# Patient Record
Sex: Female | Born: 1970 | Race: White | Hispanic: No | Marital: Married | Smoking: Never smoker
Health system: Southern US, Community
[De-identification: ages and names within clinical notes are randomized; demographics above are authoritative.]

## PROBLEM LIST (undated history)

## (undated) HISTORY — PX: CHOLECYSTECTOMY: SHX55

## (undated) HISTORY — PX: TONSILLECTOMY: SUR1361

---

## 1997-12-16 ENCOUNTER — Other Ambulatory Visit: Admission: RE | Admit: 1997-12-16 | Discharge: 1997-12-16 | Payer: Self-pay | Admitting: *Deleted

## 1998-10-26 ENCOUNTER — Ambulatory Visit (HOSPITAL_BASED_OUTPATIENT_CLINIC_OR_DEPARTMENT_OTHER): Admission: RE | Admit: 1998-10-26 | Discharge: 1998-10-26 | Payer: Self-pay | Admitting: Orthopedic Surgery

## 1998-12-16 ENCOUNTER — Other Ambulatory Visit: Admission: RE | Admit: 1998-12-16 | Discharge: 1998-12-16 | Payer: Self-pay | Admitting: *Deleted

## 1999-12-12 ENCOUNTER — Other Ambulatory Visit: Admission: RE | Admit: 1999-12-12 | Discharge: 1999-12-12 | Payer: Self-pay | Admitting: *Deleted

## 2000-12-13 ENCOUNTER — Other Ambulatory Visit: Admission: RE | Admit: 2000-12-13 | Discharge: 2000-12-13 | Payer: Self-pay | Admitting: *Deleted

## 2001-12-16 ENCOUNTER — Other Ambulatory Visit: Admission: RE | Admit: 2001-12-16 | Discharge: 2001-12-16 | Payer: Self-pay | Admitting: *Deleted

## 2003-01-05 ENCOUNTER — Other Ambulatory Visit: Admission: RE | Admit: 2003-01-05 | Discharge: 2003-01-05 | Payer: Self-pay | Admitting: *Deleted

## 2004-01-07 ENCOUNTER — Other Ambulatory Visit: Admission: RE | Admit: 2004-01-07 | Discharge: 2004-01-07 | Payer: Self-pay | Admitting: *Deleted

## 2005-01-06 ENCOUNTER — Other Ambulatory Visit: Admission: RE | Admit: 2005-01-06 | Discharge: 2005-01-06 | Payer: Self-pay | Admitting: Obstetrics and Gynecology

## 2005-08-01 ENCOUNTER — Ambulatory Visit: Payer: Self-pay | Admitting: Sports Medicine

## 2005-08-25 ENCOUNTER — Ambulatory Visit: Payer: Self-pay | Admitting: Sports Medicine

## 2015-10-11 ENCOUNTER — Other Ambulatory Visit: Payer: Self-pay | Admitting: Obstetrics and Gynecology

## 2015-10-11 DIAGNOSIS — R928 Other abnormal and inconclusive findings on diagnostic imaging of breast: Secondary | ICD-10-CM

## 2015-10-22 ENCOUNTER — Ambulatory Visit
Admission: RE | Admit: 2015-10-22 | Discharge: 2015-10-22 | Disposition: A | Discharge status: Home / Self Care | Payer: BC Managed Care – PPO | Source: Ambulatory Visit | Attending: Obstetrics and Gynecology | Admitting: Obstetrics and Gynecology

## 2015-10-22 DIAGNOSIS — R928 Other abnormal and inconclusive findings on diagnostic imaging of breast: Secondary | ICD-10-CM

## 2016-03-31 ENCOUNTER — Other Ambulatory Visit: Payer: Self-pay | Admitting: Obstetrics and Gynecology

## 2016-03-31 DIAGNOSIS — N6320 Unspecified lump in the left breast, unspecified quadrant: Secondary | ICD-10-CM

## 2016-05-02 ENCOUNTER — Ambulatory Visit
Admission: RE | Admit: 2016-05-02 | Discharge: 2016-05-02 | Disposition: A | Discharge status: Home / Self Care | Payer: BC Managed Care – PPO | Source: Ambulatory Visit | Attending: Obstetrics and Gynecology | Admitting: Obstetrics and Gynecology

## 2016-05-02 DIAGNOSIS — N6320 Unspecified lump in the left breast, unspecified quadrant: Secondary | ICD-10-CM

## 2016-09-19 ENCOUNTER — Encounter: Payer: Self-pay | Admitting: Sports Medicine

## 2016-09-19 ENCOUNTER — Ambulatory Visit (INDEPENDENT_AMBULATORY_CARE_PROVIDER_SITE_OTHER): Payer: BC Managed Care – PPO | Admitting: Sports Medicine

## 2016-09-19 ENCOUNTER — Ambulatory Visit: Payer: Self-pay

## 2016-09-19 VITALS — BP 120/54 | Ht 67.0 in | Wt 269.0 lb

## 2016-09-19 DIAGNOSIS — M25522 Pain in left elbow: Secondary | ICD-10-CM | POA: Diagnosis not present

## 2016-09-19 MED ORDER — METHYLPREDNISOLONE ACETATE 40 MG/ML IJ SUSP
40.0000 mg | Freq: Once | INTRAMUSCULAR | Status: AC
  Administered 2016-09-19: 40 mg via INTRA_ARTICULAR

## 2016-09-19 NOTE — Progress Notes (Signed)
  Melinda Jackson - 46 y.o. female MRN [id number]  Date of birth: 12-04-1970  SUBJECTIVE:  Including CC & ROS.   Melinda Jackson is a 46 yo F is a 46 year old female that is presenting with left elbow pain. This pain is occurring on the medial aspect of the past 2 months. She has been avidly working out and noticed it one day after doing a bicep curl. She denied any swelling or bruising at the time of the first incident. She has rested for the past 4-6 weeks with no improvement. She has tried deep massage and had no improvement. She denies any prior injury or surgery to the area. She feels like the pain is staying the same.  ROS: No unexpected weight loss, fever, chills, swelling, instability, numbness/tingling, redness, otherwise see HPI    HISTORY: Past Medical, Surgical, Social, and Family History Reviewed & Updated per EMR.   Pertinent Historical Findings include: PMSHx -  plantar fasciitis, tonsillectomy, cholecystectomy PSHx -  no tobacco use, occasional alcohol use FHx -  melanoma  DATA REVIEWED: None  PHYSICAL EXAM:  VS: BP:(!) 120/54  HR: bpm  TEMP: ( )  RESP:   HT:5\' 7"  (170.2 cm)   WT:269 lb (122 kg)  BMI:42.2 PHYSICAL EXAM: Gen: NAD, alert, cooperative with exam, well-appearing HEENT: clear conjunctiva, EOMI CV:  no edema, capillary refill brisk,  Resp: non-labored, normal speech Skin: no rashes, normal turgor  Neuro: no gross deficits.  Psych:  alert and oriented Left elbow:  No overlying erythema or swelling. Tenderness to palpation over the medial epicondyle. No tenderness to palpation over the olecranon or lateral epicondyle. Normal elbow range of motion. Normal Wrist range of motion. Pain with resisted flexion of the wrist Normal strength to resistance at the wrist. Negative Finkelstein's test. Negative Tinel's test at the elbow Neurovascular intact  Limited ultrasound: Left elbow:  The medial condyle was viewed in long axis and appears to be a small  hypoechoic changes in the middle of the tendon to suggest a partial tear The common extensors reviewed in long and short axis and found to be normal There appears to be increased hypervascularity at the medial condyle at the origin of the common flexors  Summary: Findings are consistent with medial epicondylitis   Ultrasound and interpretation by Melinda GandyJeremy Schmitz, MD   Aspiration/Injection Procedure Note Melinda Jackson 12-04-1970  Procedure: Injection Indications: Left medial elbow pain   Procedure Details Consent: Risks of procedure as well as the alternatives and risks of each were explained to the (patient/caregiver).  Consent for procedure obtained. Time Out: Verified patient identification, verified procedure, site/side was marked, verified correct patient position, special equipment/implants available, medications/allergies/relevent history reviewed, required imaging and test results available.  Performed.  The area was cleaned with iodine and alcohol swabs.    The left medial epicondyle was injected using 1 cc's of 40 mg Depomedrol with a 25 1 1/2" needle.  Ultrasound was used.   A sterile dressing was applied.  Patient did tolerate procedure well.   ASSESSMENT & PLAN:   Left elbow pain Appears that she has medial epicondylitis. Doesn't appear to have any older subluxation or compression of the cubital tunnel. - Injection performed today. - Provided home exercise program - Avoid lifts that normal flexion or exacerbating of her pain - Follow-up in 6 weeks

## 2016-09-19 NOTE — Assessment & Plan Note (Signed)
Appears that she has medial epicondylitis. Doesn't appear to have any older subluxation or compression of the cubital tunnel. - Injection performed today. - Provided home exercise program - Avoid lifts that normal flexion or exacerbating of her pain - Follow-up in 6 weeks

## 2016-10-09 ENCOUNTER — Other Ambulatory Visit: Payer: Self-pay | Admitting: Obstetrics and Gynecology

## 2016-10-09 DIAGNOSIS — N63 Unspecified lump in unspecified breast: Secondary | ICD-10-CM

## 2016-10-30 ENCOUNTER — Encounter: Payer: Self-pay | Admitting: Sports Medicine

## 2016-10-30 ENCOUNTER — Ambulatory Visit (INDEPENDENT_AMBULATORY_CARE_PROVIDER_SITE_OTHER): Payer: BC Managed Care – PPO | Admitting: Sports Medicine

## 2016-10-30 VITALS — BP 136/79 | Ht 67.0 in | Wt 269.0 lb

## 2016-10-30 DIAGNOSIS — M7700 Medial epicondylitis, unspecified elbow: Secondary | ICD-10-CM | POA: Insufficient documentation

## 2016-10-30 DIAGNOSIS — M7702 Medial epicondylitis, left elbow: Secondary | ICD-10-CM

## 2016-10-30 NOTE — Progress Notes (Signed)
[facility] Progress Note  Subjective:  Melinda Jackson is a 46 y.o. female who presents for follow-up of left medial epicondylitis. She was last seen for this at [facility] 09/19/16 but symptoms had been present for 2 months prior. She had steroid injection at last OV, which she think improved pain by about 50%. However, she had since also received 2 steroid IM injections and a 6-day steroid taper for bronchitis. She has tried some neutral wrist exercises at the gym at about 75% of normal weight and tolerated these okay. She had a little bit of pain when lifting a heavy plate yesterday but was able to turn her steering wheel without pain. She continues to wear her counterweight brace on L forearm. She no longer has L elbow pain at rest but does get "ghost twinges" from time to time. She is eager to get working on her arms again in the gym; works with a Systems analyst twice a week and has only been working her legs.  ROS: No numbness or tingling of hands; no fevers or chills  No Known Allergies  Objective: Blood pressure 136/79, height  (1.702 m), weight 269 lb (122 kg). Body mass index is 42.13 kg/m. Constitutional: Very pleasant, obese female in NAD Pulmonary/Chest: No respiratory distress.  Musculoskeletal: No fluid collection or swelling noted of L elbow. Minimal TTP with deep palpation over L medial epicondyle. Normal elbow and wrist ROM bilaterally. Strength 5/5 with resistance at wrist and with internal and external rotation of shoulder. Supination of L hand with wrist flexion minimally painful. No pain with resisted flexion of wrist. Negative Finkelstein's test.  Neurological: No decreased sensation of UEs. Negative Tinel's sign at elbow.  Skin: Skin is warm and dry. No rash noted. No erythema.  Psychiatric: Normal mood and affect.  Vitals reviewed  [place] of L elbow:  Small hypoechoic changes previously noted within common flexor tendon near medial epicondyle no longer seen;  instead hyperechoic changes suggestive of scar tissue noted  Assessment/Plan: Medial epicondylitis - Improving. 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  Dani Gobble, MD [facility], PGY-2  Patient seen and evaluated with the resident. I agree with the above plan of care. Patient's medial epicondylitis is improving. Repeat ultrasound shows scar tissue formation in the area of her previous tear. Proceed with activity as outlined above. Follow-up with me as needed.

## 2016-10-30 NOTE — Assessment & Plan Note (Signed)
-   Improving. 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

## 2016-11-02 ENCOUNTER — Ambulatory Visit
Admission: RE | Admit: 2016-11-02 | Discharge: 2016-11-02 | Disposition: A | Discharge status: Home / Self Care | Payer: BC Managed Care – PPO | Source: Ambulatory Visit | Attending: Obstetrics and Gynecology | Admitting: Obstetrics and Gynecology

## 2016-11-02 DIAGNOSIS — N63 Unspecified lump in unspecified breast: Secondary | ICD-10-CM

## 2017-05-06 IMAGING — US US BREAST 10
1 series · 5 of 5 positions shown · non-contrast
Comparison: Mammography 10/05/2015, 09/11/2014 and earlier.

CLINICAL DATA: Recall from screening mammography, bilateral breast
masses.

EXAM:
2D DIGITAL DIAGNOSTIC BILATERAL MAMMOGRAM WITH CAD AND ADJUNCT TOMO
LIMITED ULTRASOUND BILATERAL BREASTS

[Series 1: us breast 10 · 0.09mm/px · 5 of 5 slices shown]
[im 1/5]
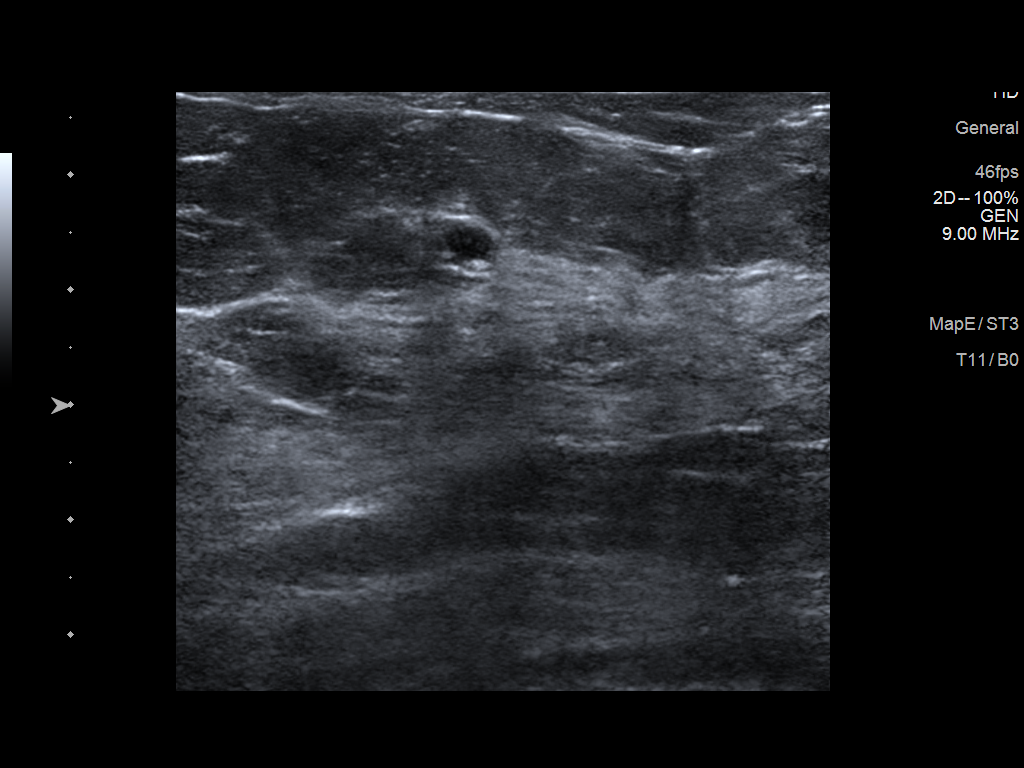
[im 2/5]
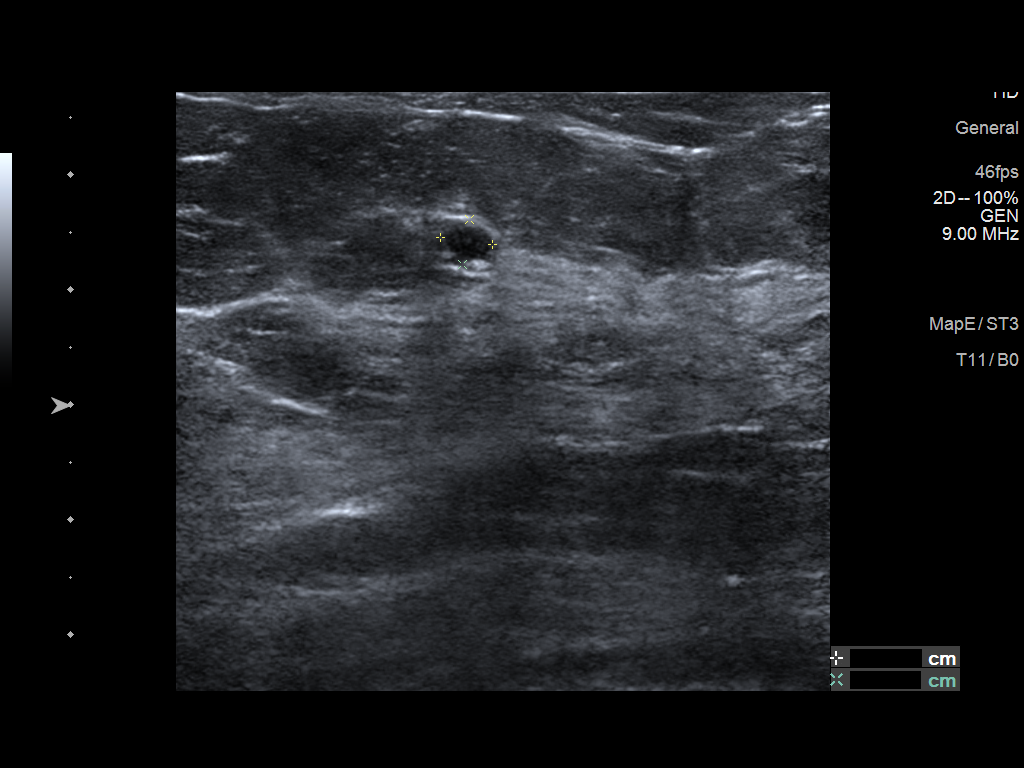
[im 3/5]
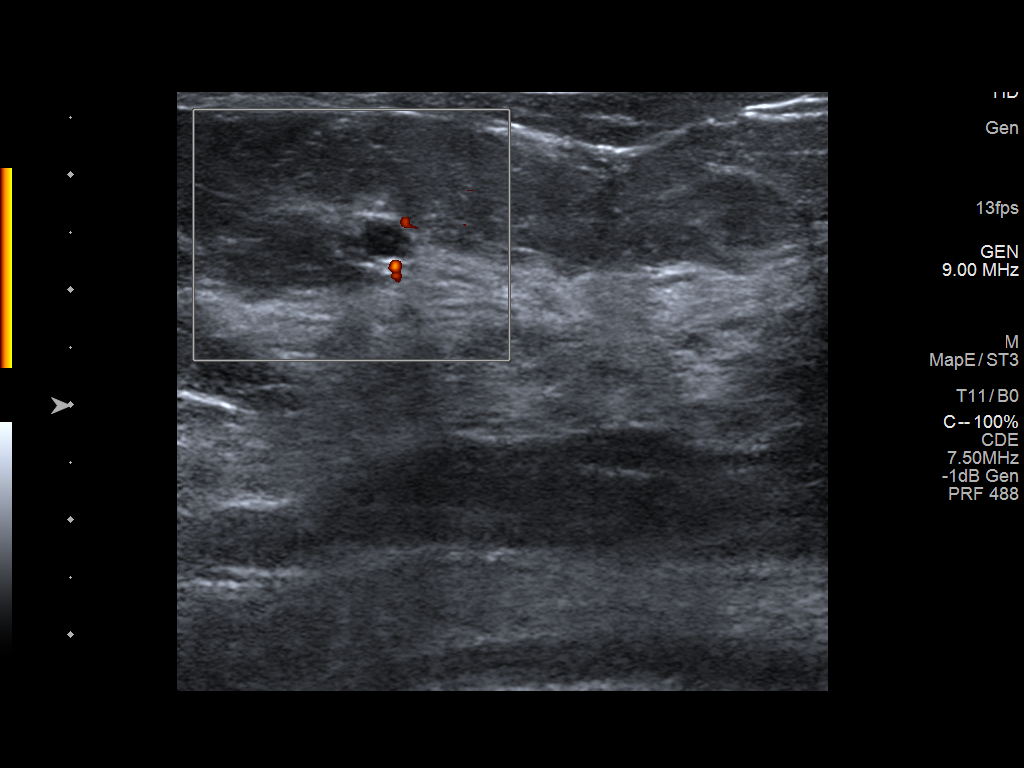
[im 4/5]
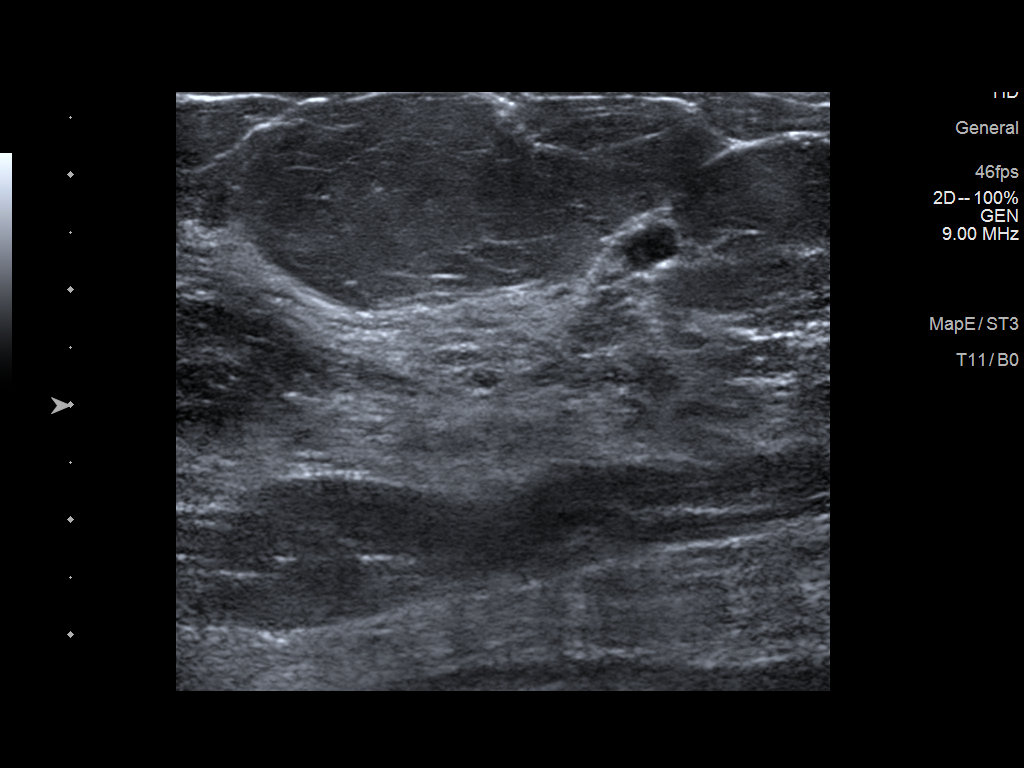
[im 5/5]
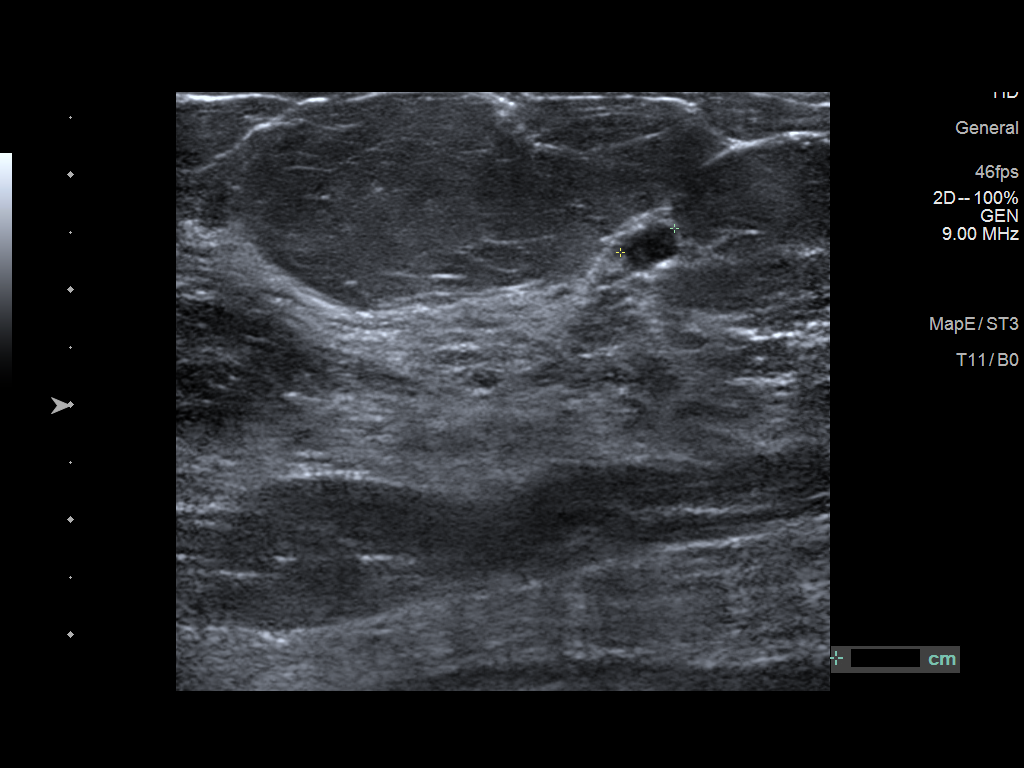

[5 of 5 positions shown; findings below may reference images not displayed]

No
prior ultrasound.

ACR Breast Density Category c: The breast tissue is heterogeneously
dense, which may obscure small masses.
FINDINGS: Standard 2D and tomosynthesis full field CC and MLO views of both
breasts were obtained.

The area of concern in the upper right breast, middle depth,
measures approximately 5 mm and is associated with a small
calcification. There is no associated architectural distortion. No
findings suspicious for malignancy in the right breast.

The area concern in the upper outer left breast, middle to posterior
depth, is less well seen on the tomosynthesis images, though there
are other circumscribed masses in the inner left breast without
associated architectural distortion or suspicious calcification. No
findings suspicious for malignancy in the left breast.

Mammographic images were processed with CAD.

On physical exam, there is no palpable abnormality in either breast.

Targeted right breast ultrasound is performed, showing an oval
circumscribed parallel nearly anechoic mass with acoustic
enhancement, no internal power Doppler flow, and a calcification in
its posterior wall at the 12:30 o'clock position approximately 4 cm
from the nipple measuring approximately 5 x 4 x 5 mm, accounting for
the area of concern on screening mammography. No suspicious solid
mass or abnormal acoustic shadowing is identified.

Targeted left breast ultrasound is performed, showing an oval
circumscribed hypoechoic mass with equal acoustic enhancement and no
internal power Doppler flow at the 1 o'clock position approximately
5 cm from the nipple measuring approximately 6 x 3 x 5 mm. No solid
mass or abnormal acoustic shadowing is identified.
IMPRESSION: 1. Likely benign complex cyst or fibroadenoma in the upper outer
quadrant of the left breast accounting for the area concern on
screening mammography.
2. Benign cyst in the upper inner quadrant of the right breast
accounting for the area of concern on screening mammography. No
mammographic or sonographic evidence of malignancy, right breast.

RECOMMENDATION:
Six-month follow-up left breast ultrasound to confirm stability of
the likely benign mass in the upper outer quadrant.

I have discussed the findings and recommendations with the patient.
Results were also provided in writing at the conclusion of the
visit. If applicable, a reminder letter will be sent to the patient
regarding the next appointment.

BI-RADS CATEGORY  3: Probably benign.

## 2017-05-06 IMAGING — US US BREAST 10
1 series · 5 of 5 positions shown · non-contrast
Comparison: Mammography 10/05/2015, 09/11/2014 and earlier.

CLINICAL DATA: Recall from screening mammography, bilateral breast
masses.

EXAM:
2D DIGITAL DIAGNOSTIC BILATERAL MAMMOGRAM WITH CAD AND ADJUNCT TOMO
LIMITED ULTRASOUND BILATERAL BREASTS

[Series 1: us breast 10 · 0.08mm/px · 5 of 5 slices shown]
[im 1/5]
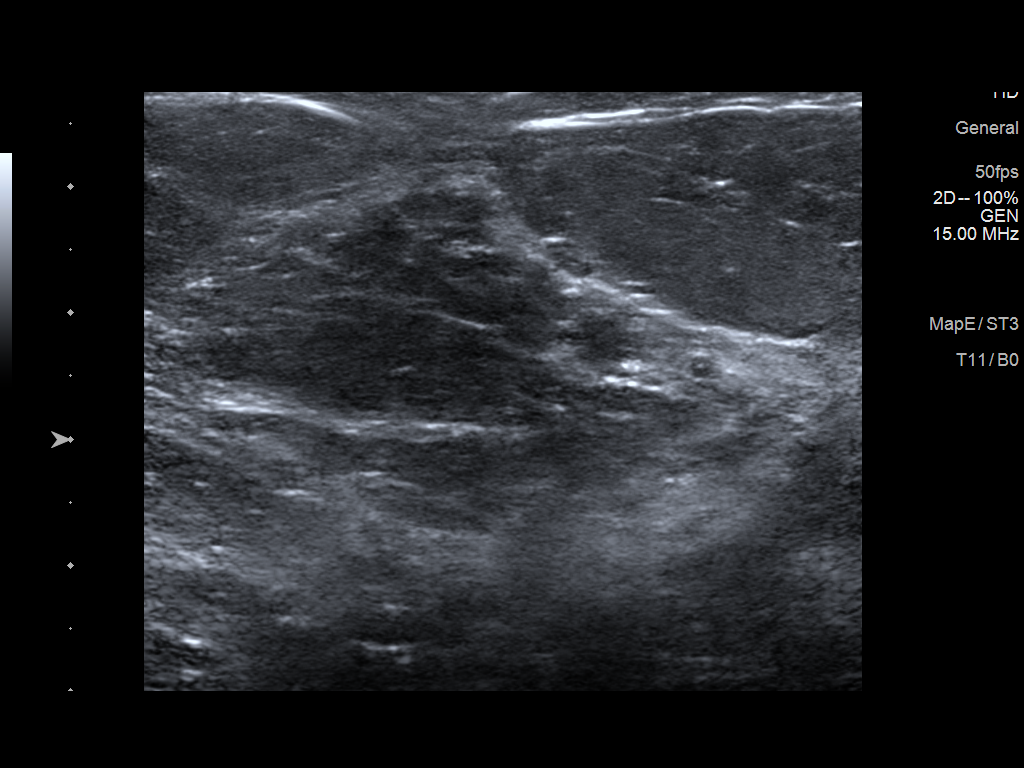
[im 2/5]
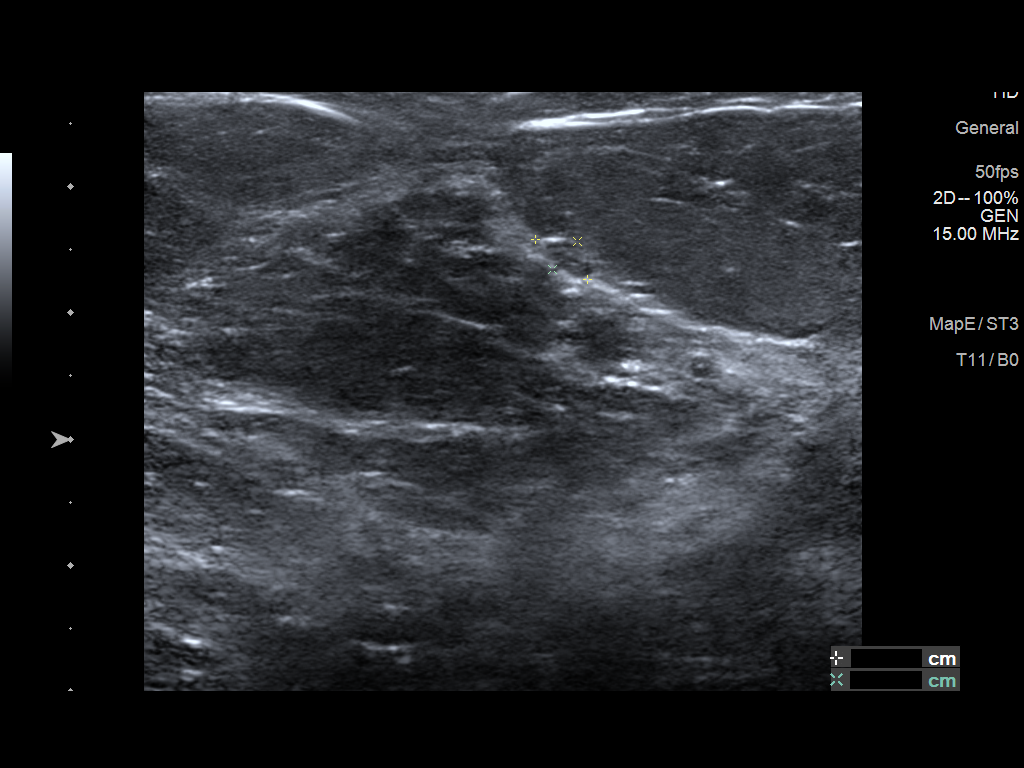
[im 3/5]
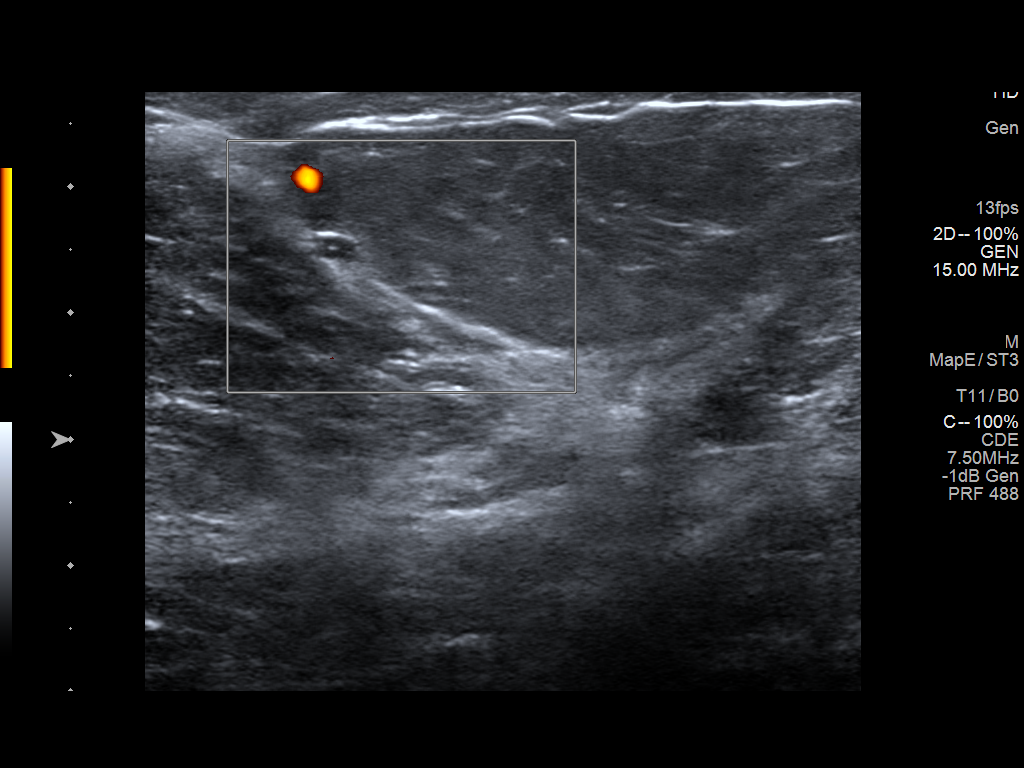
[im 4/5]
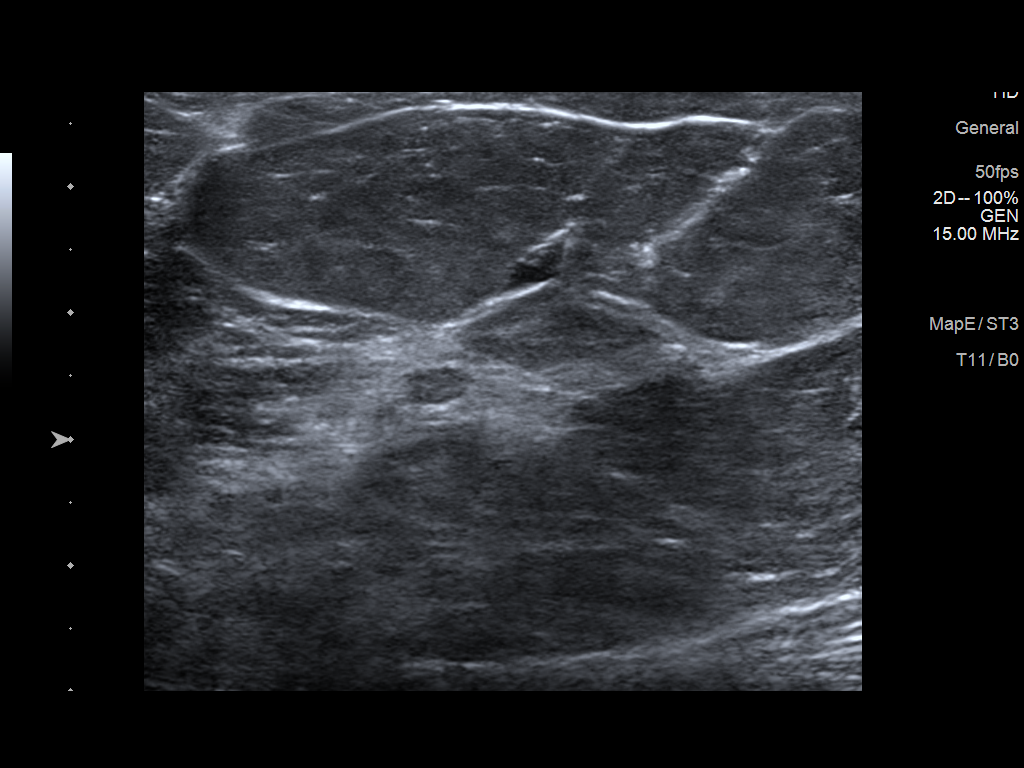
[im 5/5]
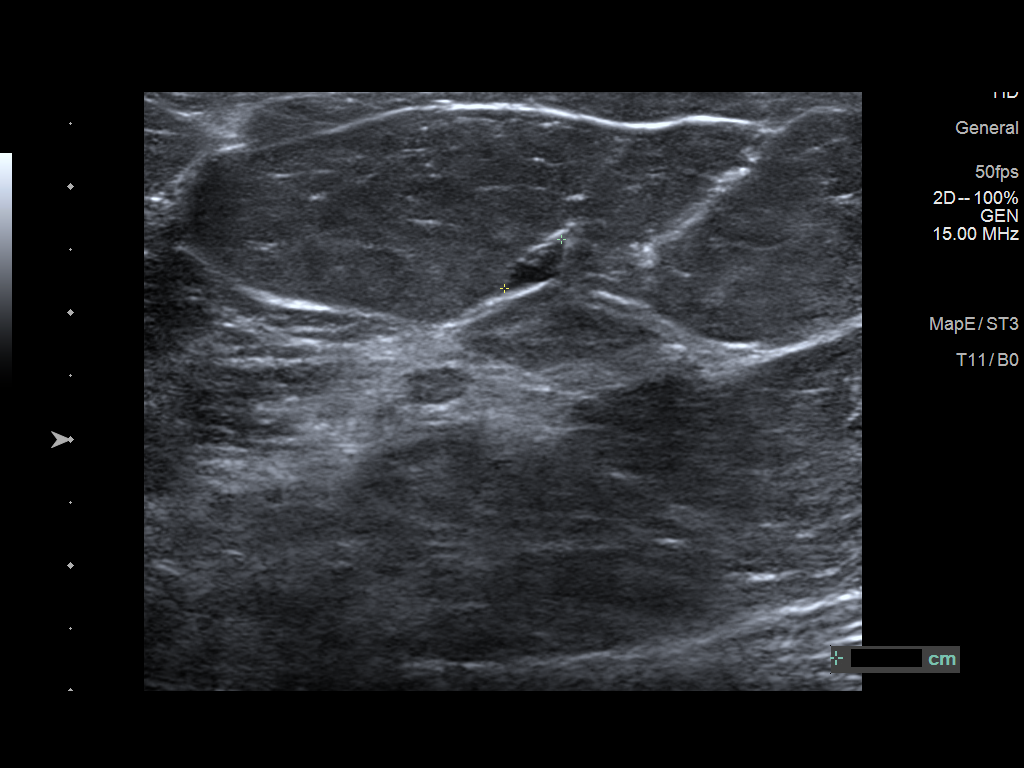

[5 of 5 positions shown; findings below may reference images not displayed]

No
prior ultrasound.

ACR Breast Density Category c: The breast tissue is heterogeneously
dense, which may obscure small masses.
FINDINGS: Standard 2D and tomosynthesis full field CC and MLO views of both
breasts were obtained.

The area of concern in the upper right breast, middle depth,
measures approximately 5 mm and is associated with a small
calcification. There is no associated architectural distortion. No
findings suspicious for malignancy in the right breast.

The area concern in the upper outer left breast, middle to posterior
depth, is less well seen on the tomosynthesis images, though there
are other circumscribed masses in the inner left breast without
associated architectural distortion or suspicious calcification. No
findings suspicious for malignancy in the left breast.

Mammographic images were processed with CAD.

On physical exam, there is no palpable abnormality in either breast.

Targeted right breast ultrasound is performed, showing an oval
circumscribed parallel nearly anechoic mass with acoustic
enhancement, no internal power Doppler flow, and a calcification in
its posterior wall at the 12:30 o'clock position approximately 4 cm
from the nipple measuring approximately 5 x 4 x 5 mm, accounting for
the area of concern on screening mammography. No suspicious solid
mass or abnormal acoustic shadowing is identified.

Targeted left breast ultrasound is performed, showing an oval
circumscribed hypoechoic mass with equal acoustic enhancement and no
internal power Doppler flow at the 1 o'clock position approximately
5 cm from the nipple measuring approximately 6 x 3 x 5 mm. No solid
mass or abnormal acoustic shadowing is identified.
IMPRESSION: 1. Likely benign complex cyst or fibroadenoma in the upper outer
quadrant of the left breast accounting for the area concern on
screening mammography.
2. Benign cyst in the upper inner quadrant of the right breast
accounting for the area of concern on screening mammography. No
mammographic or sonographic evidence of malignancy, right breast.

RECOMMENDATION:
Six-month follow-up left breast ultrasound to confirm stability of
the likely benign mass in the upper outer quadrant.

I have discussed the findings and recommendations with the patient.
Results were also provided in writing at the conclusion of the
visit. If applicable, a reminder letter will be sent to the patient
regarding the next appointment.

BI-RADS CATEGORY  3: Probably benign.

## 2017-10-12 ENCOUNTER — Other Ambulatory Visit: Payer: Self-pay | Admitting: Family Medicine

## 2017-10-12 ENCOUNTER — Ambulatory Visit
Admission: RE | Admit: 2017-10-12 | Discharge: 2017-10-12 | Disposition: A | Discharge status: Home / Self Care | Payer: Self-pay | Source: Ambulatory Visit | Attending: Family Medicine | Admitting: Family Medicine

## 2017-10-12 ENCOUNTER — Ambulatory Visit
Admission: RE | Admit: 2017-10-12 | Discharge: 2017-10-12 | Disposition: A | Discharge status: Home / Self Care | Payer: BC Managed Care – PPO | Source: Ambulatory Visit | Attending: Family Medicine | Admitting: Family Medicine

## 2017-10-12 DIAGNOSIS — J189 Pneumonia, unspecified organism: Secondary | ICD-10-CM

## 2017-10-12 DIAGNOSIS — J181 Lobar pneumonia, unspecified organism: Principal | ICD-10-CM

## 2017-12-24 ENCOUNTER — Other Ambulatory Visit: Payer: Self-pay | Admitting: Obstetrics and Gynecology

## 2017-12-24 DIAGNOSIS — Z1231 Encounter for screening mammogram for malignant neoplasm of breast: Secondary | ICD-10-CM

## 2018-01-14 ENCOUNTER — Ambulatory Visit
Admission: RE | Admit: 2018-01-14 | Discharge: 2018-01-14 | Disposition: A | Discharge status: Home / Self Care | Payer: BC Managed Care – PPO | Source: Ambulatory Visit | Attending: Obstetrics and Gynecology | Admitting: Obstetrics and Gynecology

## 2018-01-14 DIAGNOSIS — Z1231 Encounter for screening mammogram for malignant neoplasm of breast: Secondary | ICD-10-CM

## 2019-03-03 ENCOUNTER — Other Ambulatory Visit: Payer: Self-pay | Admitting: Family Medicine

## 2019-03-03 DIAGNOSIS — M7989 Other specified soft tissue disorders: Secondary | ICD-10-CM

## 2019-03-06 ENCOUNTER — Ambulatory Visit
Admission: RE | Admit: 2019-03-06 | Discharge: 2019-03-06 | Disposition: A | Discharge status: Home / Self Care | Payer: BC Managed Care – PPO | Source: Ambulatory Visit | Attending: Family Medicine | Admitting: Family Medicine

## 2019-03-06 DIAGNOSIS — M7989 Other specified soft tissue disorders: Secondary | ICD-10-CM

## 2019-07-01 ENCOUNTER — Other Ambulatory Visit: Payer: Self-pay

## 2019-07-01 ENCOUNTER — Encounter: Payer: Self-pay | Admitting: Sports Medicine

## 2019-07-01 ENCOUNTER — Ambulatory Visit: Payer: BC Managed Care – PPO | Admitting: Sports Medicine

## 2019-07-01 VITALS — BP 146/80 | Ht 67.0 in | Wt 260.4 lb

## 2019-07-01 DIAGNOSIS — S46002A Unspecified injury of muscle(s) and tendon(s) of the rotator cuff of left shoulder, initial encounter: Secondary | ICD-10-CM

## 2019-07-01 NOTE — Progress Notes (Signed)
   [facility] [address] [place], [place] [address] Phone: [phone] Fax: [phone]   Patient Name: Melinda Jackson Date of Birth: 04/11/1971 Medical Record Number: [id number] Gender: female Date of Encounter: 07/01/2019  SUBJECTIVE:      Chief Complaint:  Left shoulder pain   HPI:  Is a 48 year old RHD female with 3 months of left shoulder pain.  There was no specific mechanism of injury.  Aggravating factors include extending her arm behind her.  Alleviating factors include rest and ibuprofen.  The pain has gradually worsened over the 3 months.  He denies any radiating symptoms into her fingers.  No prior injury to the shoulder before.  She denies any swelling, erythema, weakness, instability, numbness, tingling, or skin changes.     ROS:     See HPI.   PERTINENT  PMH / PSH / FH / SH:  Past Medical, Surgical, Social, and Family History Reviewed & Updated in the EMR. Pertinent findings include:  Medial epicondylitis, obesity, hypothyroidism   OBJECTIVE:  BP (!) 146/80   Ht 5\' 7"  (1.702 m)   Wt 260 lb 6.4 oz (118.1 kg)   BMI 40.78 kg/m  Physical Exam:  Vital signs are reviewed.   GEN: Alert and oriented, NAD Pulm: Breathing unlabored PSY: normal mood, congruent affect  MSK: Left shoulder Well developed, well nourished, in no acute distress. No swelling, ecchymoses.  No gross deformity. No TTP. FROM. Strength is 4/5 with empty can and resisted external rotation with pain Positive Neers. Negative Yergasons. Negative apprehension. NV intact distally.  Right shoulder Well developed, well nourished, in no acute distress. No swelling, ecchymoses.  No gross deformity. No TTP. FROM. Strength 5/5 with empty can and resisted internal/external rotation. Negative Hawkins, Neers. Negative Yergasons. Negative apprehension. NV intact distally.  Limited MSK Ultrasound: Right shoulder No evidence of joint effusion.   The biceps  brachii long head tendon is normal without tendinosis, tear, tenosynovitis, or subluxation/dislocation in short and long axis view. 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 Left rotator cuff tendinosis  We will trial home exercise program and rotator cuff strengthening exercises.  She can continue to use ibuprofen as needed.  We will see her back in 4-6 weeks, at which time if there is minimal to no improvement we can consider a corticosteroid injection.   Lanier Clam, DO, ATC Sports Medicine Fellow  Patient seen and evaluated with the sports medicine fellow.  I agree with the above plan of care.  No obvious rotator cuff tear seen on ultrasound.  Treatment as above.  Follow-up in 4 weeks.  If no improvement consider subacromial cortisone injection and formal physical therapy.

## 2019-07-29 ENCOUNTER — Ambulatory Visit: Payer: Self-pay

## 2019-07-29 ENCOUNTER — Other Ambulatory Visit: Payer: Self-pay

## 2019-07-29 ENCOUNTER — Ambulatory Visit: Payer: BC Managed Care – PPO | Admitting: Sports Medicine

## 2019-07-29 VITALS — BP 124/82 | Ht 67.0 in | Wt 255.0 lb

## 2019-07-29 DIAGNOSIS — M19012 Primary osteoarthritis, left shoulder: Secondary | ICD-10-CM | POA: Diagnosis not present

## 2019-07-29 DIAGNOSIS — M25512 Pain in left shoulder: Secondary | ICD-10-CM

## 2019-07-29 MED ORDER — METHYLPREDNISOLONE ACETATE 40 MG/ML IJ SUSP
40.0000 mg | Freq: Once | INTRAMUSCULAR | Status: AC
  Administered 2019-07-29: 40 mg via INTRA_ARTICULAR

## 2019-07-29 NOTE — Progress Notes (Signed)
   [facility] [address] [place], [place] [address] Phone: [phone] Fax: [phone]   Patient Name: Melinda Jackson Date of Birth: 04/28/1971 Medical Record Number: [id number] Gender: female Date of Encounter: 07/29/2019  SUBJECTIVE:      Chief Complaint:  Left shoulder pain follow-up   HPI:  Issa is following up for left shoulder pain.  We last saw her 1 month ago at which time she was started on a home exercise program for rotator cuff tendinosis.  She has been diligent with the exercises, but has not noticed much of a difference.  She still has some pain with extension and external rotation.  Her pain is mainly on the top of her shoulder.  She denies any numbness or tingling into her fingers.  No new injury or swelling.     ROS:     See HPI.   PERTINENT  PMH / PSH / FH / SH:  Past Medical, Surgical, Social, and Family History Reviewed & Updated in the EMR.    OBJECTIVE:  BP 124/82   Ht 5\' 7"  (1.702 m)   Wt 255 lb (115.7 kg)   BMI 39.94 kg/m  Physical Exam:  Vital signs are reviewed.   GEN: Alert and oriented, NAD Pulm: Breathing unlabored PSY: normal mood, congruent affect  MSK: Left shoulder Well developed, well nourished, in no acute distress. No swelling, ecchymoses.  No gross deformity. TTP at [facility] joint FROM. Strength 5/5 with empty can and resisted internal/external rotation. Negative Hawkins, Neers. Negative Yergasons. Negative apprehension. Positive crossarm test NV intact distally.  Procedure Left AC joint injection After informed consent was obtained, the patient was seated in the exam room.  Her left shoulder was cleaned in a sterile manner.  Utilizing an out of plane approach, the left AC joint was injected under ultrasound guidance with a 1: 1 mixture of depomedrol:Lidocaine 1%.  Patient tolerated the procedure well complication.  ASSESSMENT & PLAN:   1. Left AC joint arthropathy  Successful injection as  above.  We are hopeful that this will offer her some pain relief.  She can use topical Voltaren gel if still painful.  We advised her to follow-up with [facility] in some fashion in 1 month, if still having symptoms, at that time we can consider an MRI.   [place], DO, ATC Sports Medicine Fellow  Patient seen and evaluated with the sports medicine fellow.  I agree with the above plan of care.  Patient has good rotator cuff strength and no real signs of impingement.  Main pain is over the acromioclavicular joint.  Injection as above.  If patient continues to have pain despite injection and I would consider further diagnostic imaging.  I have asked that she follow-up with me in about 4 weeks for check on her progress.

## 2019-08-01 NOTE — Addendum Note (Signed)
Addended by: Rutha Bouchard E on: 08/01/2019 09:19 AM   Modules accepted: Orders

## 2019-08-19 ENCOUNTER — Ambulatory Visit
Admission: RE | Admit: 2019-08-19 | Discharge: 2019-08-19 | Disposition: A | Discharge status: Home / Self Care | Payer: BC Managed Care – PPO | Source: Ambulatory Visit | Attending: Sports Medicine | Admitting: Sports Medicine

## 2019-08-19 ENCOUNTER — Telehealth (INDEPENDENT_AMBULATORY_CARE_PROVIDER_SITE_OTHER): Payer: BC Managed Care – PPO | Admitting: Sports Medicine

## 2019-08-19 VITALS — Ht 67.0 in | Wt 247.0 lb

## 2019-08-19 DIAGNOSIS — M25512 Pain in left shoulder: Secondary | ICD-10-CM

## 2019-08-20 ENCOUNTER — Other Ambulatory Visit: Payer: Self-pay

## 2019-08-20 DIAGNOSIS — M25512 Pain in left shoulder: Secondary | ICD-10-CM

## 2019-08-20 NOTE — Progress Notes (Signed)
   Subjective:    Patient ID: Melinda Jackson, female    DOB: [birth year], 49 y.o.   MRN: [id number]  HPI  Patient presents today via virtual visit for follow-up on left shoulder pain.  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  Unfortunately, this did not provide her with much pain relief.  She still endorses pain in the anterior shoulder around the area of the [facility] joint with reaching directly overhead or away from her body.  Also pain with reaching around behind her back.  Previous ultrasound had shown evidence of supraspinatus tendinosis but no obvious tear.  She has been very diligent with her home exercises.   Review of Systems As above    Objective:   Physical Exam  Physical exam was somewhat limited due to the nature of our video visit.  She does demonstrate full range of motion.  She localizes her pain around the area of the acromioclavicular joint.  No real pain with crossover testing.  She appears neurovascularly intact.  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  Otherwise unremarkable.      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  Although her previous ultrasound did not show any obvious rotator cuff tear, we will need to proceed with an MRI of the left shoulder primarily for preoperative planning.  Patient is interested in a distal clavicle excision if warranted.  The MRI will also evaluate for other potential shoulder pathology not seen on ultrasound or x-ray.  Phone follow-up with those results when available and we will delineate further treatment based on those  findings.

## 2019-09-09 ENCOUNTER — Ambulatory Visit
Admission: RE | Admit: 2019-09-09 | Discharge: 2019-09-09 | Disposition: A | Discharge status: Home / Self Care | Payer: BC Managed Care – PPO | Source: Ambulatory Visit | Attending: Sports Medicine | Admitting: Sports Medicine

## 2019-09-09 DIAGNOSIS — M25512 Pain in left shoulder: Secondary | ICD-10-CM

## 2019-09-15 ENCOUNTER — Telehealth: Payer: Self-pay | Admitting: Sports Medicine

## 2019-09-15 ENCOUNTER — Other Ambulatory Visit: Payer: Self-pay | Admitting: Obstetrics and Gynecology

## 2019-09-15 ENCOUNTER — Other Ambulatory Visit: Payer: Self-pay

## 2019-09-15 DIAGNOSIS — R928 Other abnormal and inconclusive findings on diagnostic imaging of breast: Secondary | ICD-10-CM

## 2019-09-15 DIAGNOSIS — M25512 Pain in left shoulder: Secondary | ICD-10-CM

## 2019-09-15 NOTE — Telephone Encounter (Signed)
  I spoke with Melinda Jackson on the phone today after reviewing MRI findings of her left shoulder.  She has moderate tendinosis of the supraspinatus tendon.  Also a possible longitudinal split tear of the proximal biceps tendon.  Mild to moderate AC degenerative changes.  Given her failure to improve with conservative treatment to date, I've  recommended a consultation with Dr. Everardo Pacific at [facility] to discuss possible surgical options.  I will defer further work-up and treatment to the discretion of Dr. Everardo Pacific and the patient will follow up with me as needed.

## 2019-09-29 ENCOUNTER — Ambulatory Visit
Admission: RE | Admit: 2019-09-29 | Discharge: 2019-09-29 | Disposition: A | Discharge status: Home / Self Care | Payer: BC Managed Care – PPO | Source: Ambulatory Visit | Attending: Obstetrics and Gynecology | Admitting: Obstetrics and Gynecology

## 2019-09-29 ENCOUNTER — Other Ambulatory Visit: Payer: Self-pay | Admitting: Obstetrics and Gynecology

## 2019-09-29 ENCOUNTER — Other Ambulatory Visit: Payer: Self-pay

## 2019-09-29 DIAGNOSIS — R928 Other abnormal and inconclusive findings on diagnostic imaging of breast: Secondary | ICD-10-CM

## 2019-09-29 DIAGNOSIS — N6310 Unspecified lump in the right breast, unspecified quadrant: Secondary | ICD-10-CM

## 2019-12-10 ENCOUNTER — Other Ambulatory Visit: Payer: Self-pay | Admitting: Obstetrics and Gynecology

## 2019-12-10 MED ORDER — LORAZEPAM 1 MG PO TABS: 1.0000 mg | ORAL_TABLET | Freq: Two times a day (BID) | ORAL | 0 refills | Status: DC

## 2019-12-10 NOTE — Progress Notes (Signed)
Spoke to pt after traumatic experience.  She is crying uncontrollably and I will send in ativan to help her over next couple days.

## 2019-12-10 NOTE — Addendum Note (Signed)
Addended by: Carrington Clamp A on: 12/10/2019 05:29 PM   Modules accepted: Orders

## 2020-04-05 ENCOUNTER — Other Ambulatory Visit: Payer: Self-pay | Admitting: Obstetrics and Gynecology

## 2020-04-05 ENCOUNTER — Other Ambulatory Visit: Payer: Self-pay

## 2020-04-05 ENCOUNTER — Ambulatory Visit
Admission: RE | Admit: 2020-04-05 | Discharge: 2020-04-05 | Disposition: A | Discharge status: Home / Self Care | Payer: BC Managed Care – PPO | Source: Ambulatory Visit | Attending: Obstetrics and Gynecology | Admitting: Obstetrics and Gynecology

## 2020-04-05 DIAGNOSIS — N6310 Unspecified lump in the right breast, unspecified quadrant: Secondary | ICD-10-CM

## 2020-06-15 ENCOUNTER — Ambulatory Visit (INDEPENDENT_AMBULATORY_CARE_PROVIDER_SITE_OTHER): Payer: BC Managed Care – PPO | Admitting: Sports Medicine

## 2020-06-15 ENCOUNTER — Other Ambulatory Visit: Payer: Self-pay

## 2020-06-15 VITALS — BP 124/82 | Ht 67.0 in | Wt 202.0 lb

## 2020-06-15 DIAGNOSIS — S66911A Strain of unspecified muscle, fascia and tendon at wrist and hand level, right hand, initial encounter: Secondary | ICD-10-CM

## 2020-06-15 MED ORDER — MELOXICAM 7.5 MG PO TABS: ORAL_TABLET | ORAL | 0 refills | Status: DC

## 2020-06-16 ENCOUNTER — Encounter: Payer: Self-pay | Admitting: Sports Medicine

## 2020-06-16 NOTE — Progress Notes (Signed)
   Subjective:    Patient ID: Melinda Jackson, female    DOB: 07-18-1970, 49 y.o.   MRN: [id number]  HPI chief complaint: Right wrist pain  Very pleasant 49 year old right-hand-dominant female comes in today complaining of right wrist pain that began while playing tennis on November 27.  After an 18 year absence, she recently decided to take up tennis again.  She has really been enjoying it.  On 27 November, she was hitting with a very good player returning his serves.  She felt a "twinge" along the ulnar aspect of the right wrist but continued to play.  Pain continued to intensify.  She has not played tennis since and her pain has improved but not resolved.  She notices it most when doing certain activities such as pulling down on the bottom of her shirt.  She has been taking 7.5 mg of meloxicam which has been helping.  She takes it at night because it makes her sleepy.  She also applied ice to her wrist immediately after the injury.  She denies any significant problems with this wrist in the past.  She denies radial sided wrist pain.  She may have noticed some slight swelling at the time of the injury but no bruising.  Inner medical history reviewed Interim surgical history reviewed Medications reviewed Allergies reviewed    Review of Systems As above    Objective:   Physical Exam  Well-developed, well-nourished.  No acute distress.  Right wrist: Full wrist range of motion.  No effusion.  No obvious soft tissue swelling.  No ecchymosis.  Slight tenderness to palpation along the distal ECU tendon.  Reproducible pain with passive radial deviation of the wrist.  No pain or weakness with resisted ulnar deviation.  No tenderness to palpation at the TFCC.  Negative liftoff.  No tenderness at the snuffbox.  Good grip strength.  Good pulses.  Limited MSK ultrasound of the right wrist was performed.  There is fluid surrounding the ECU tendon but no obvious tear.  Visualized TFCC appears  unremarkable.      Assessment & Plan:   Right wrist pain secondary to ECU tendon strain  Patient will continue on her meloxicam 7.5 mg nightly for the next 7 days.  I did provide her with a refill.  We will fit her with a body helix wrist sleeve which she will wear daily until her symptoms resolve.  I then want her to wear it when she resumes tennis.  I explained to her that it may take 3 to 4 weeks for this injury to improve to the point that she is able to return to competitive tennis.  If symptoms persist, she will return to the office and we will consider further diagnostic imaging.  Follow-up for ongoing or recalcitrant issues.

## 2020-07-29 ENCOUNTER — Other Ambulatory Visit: Payer: Self-pay | Admitting: Sports Medicine

## 2020-09-21 ENCOUNTER — Other Ambulatory Visit: Payer: Self-pay | Admitting: Sports Medicine

## 2020-10-04 ENCOUNTER — Other Ambulatory Visit: Payer: Self-pay

## 2020-10-04 ENCOUNTER — Ambulatory Visit
Admission: RE | Admit: 2020-10-04 | Discharge: 2020-10-04 | Disposition: A | Discharge status: Home / Self Care | Payer: BC Managed Care – PPO | Source: Ambulatory Visit | Attending: Obstetrics and Gynecology | Admitting: Obstetrics and Gynecology

## 2020-10-04 DIAGNOSIS — N6310 Unspecified lump in the right breast, unspecified quadrant: Secondary | ICD-10-CM

## 2020-10-19 ENCOUNTER — Other Ambulatory Visit: Payer: Self-pay | Admitting: Sports Medicine

## 2021-04-13 IMAGING — MG MM DIGITAL DIAGNOSTIC UNILAT*R* W/ TOMO W/ CAD
6 series · 6 of 18 positions shown · non-contrast
Comparison: Previous exam(s).

CLINICAL DATA: Recall from screening mammography with
tomosynthesis, possible masses involving the RIGHT breast.

EXAM:
DIGITAL DIAGNOSTIC RIGHT MAMMOGRAM WITH TOMO
ULTRASOUND RIGHT BREAST

[R MLO synth-2D (1 of 2)]
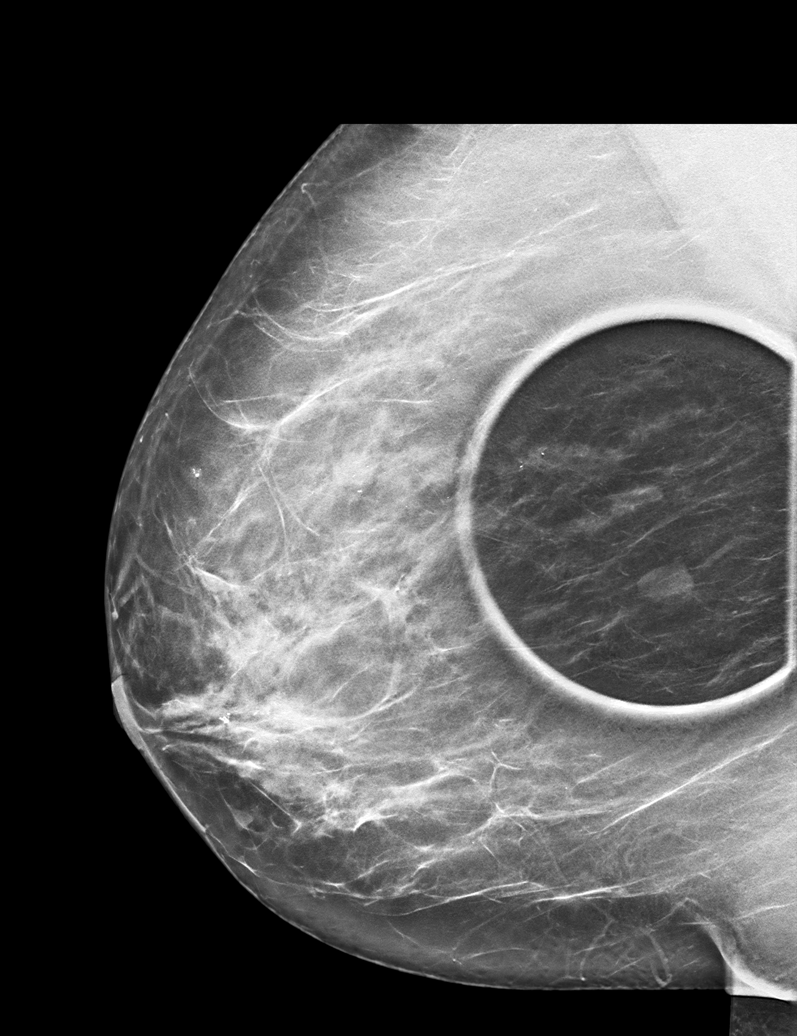

[R CC synth-2D]
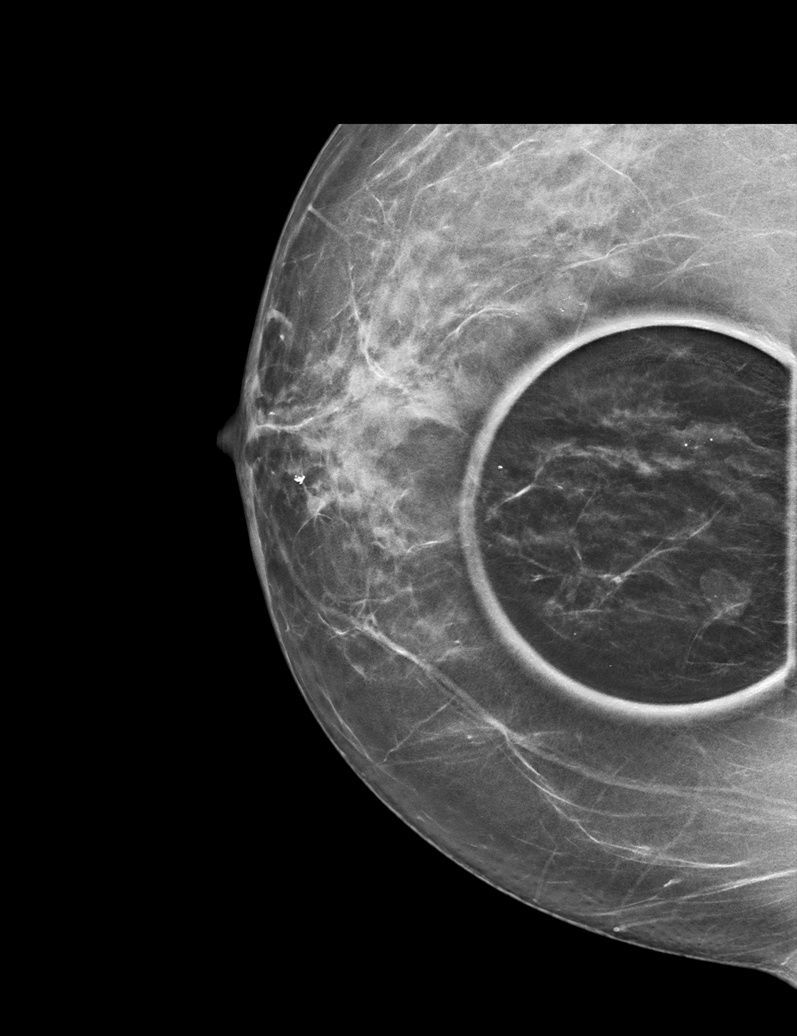

[R MLO synth-2D (2 of 2)]
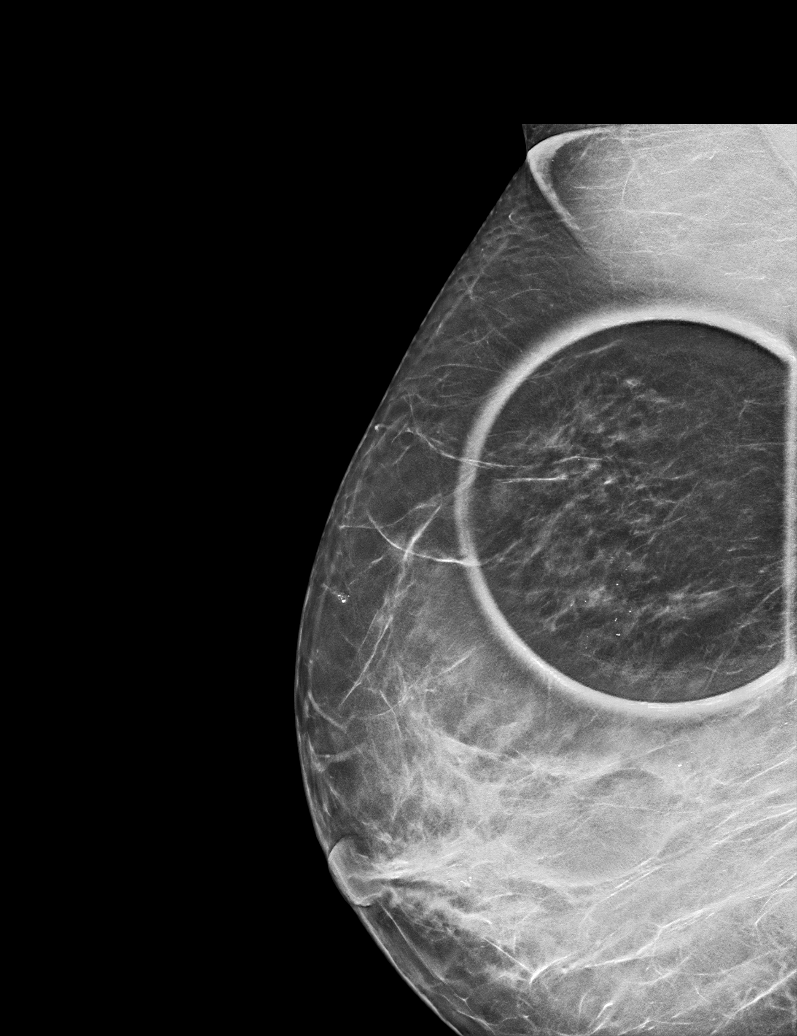

[R MLO tomo (1 of 2) · tomo slice 31/60.0]
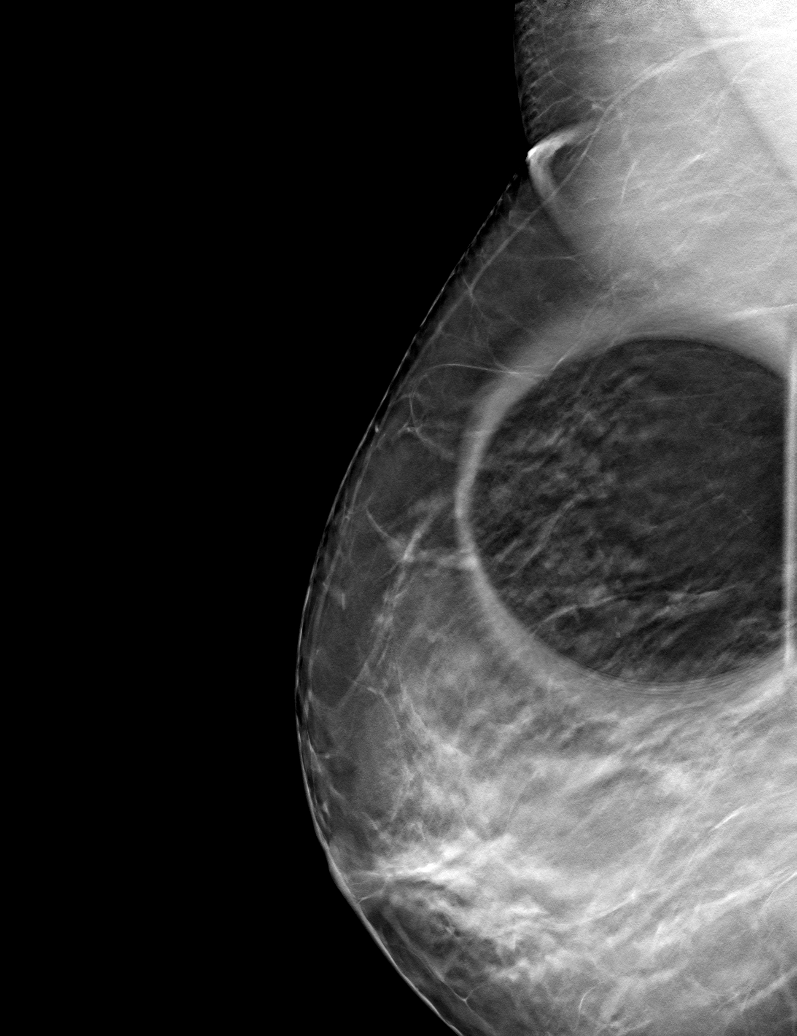

[R MLO tomo (2 of 2) · tomo slice 33/65.0]
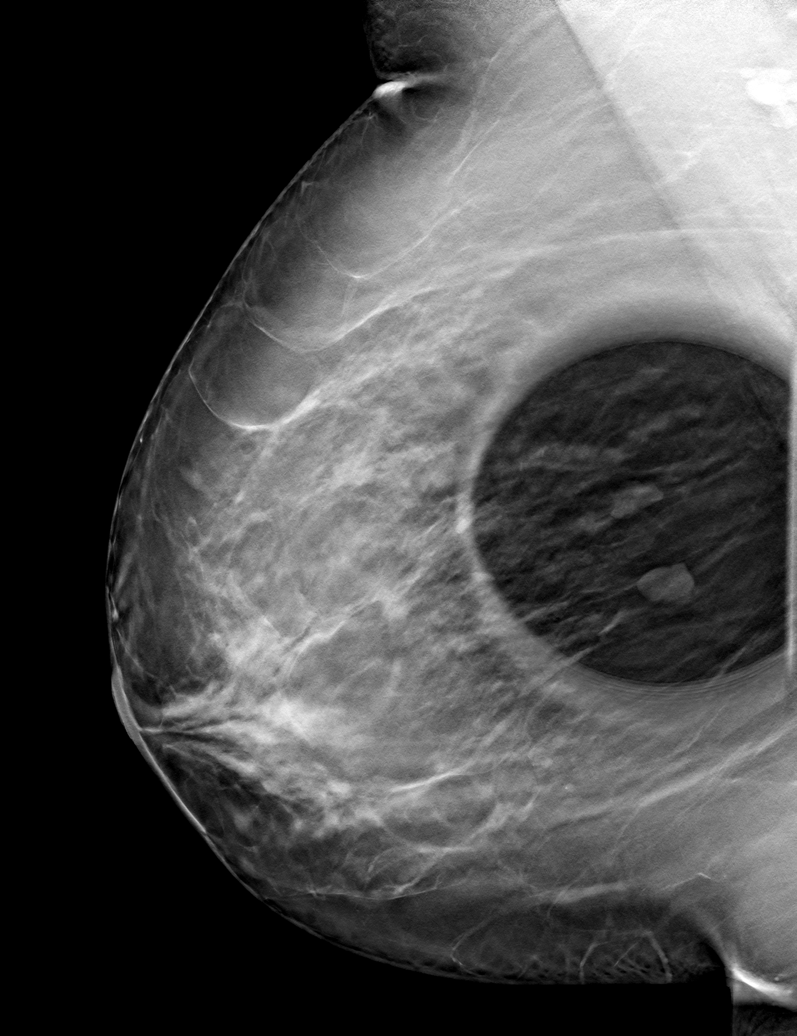

[R CC tomo · tomo slice 27/54.0]
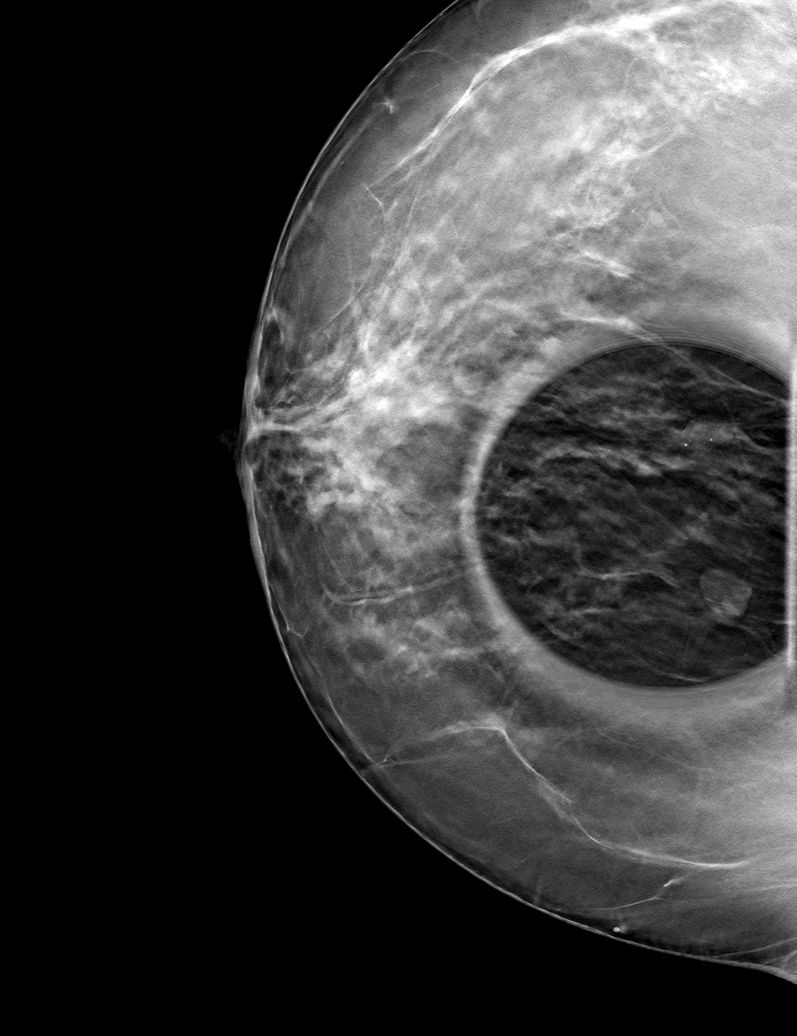

[6 of 18 positions shown; findings below may reference images not displayed]

ACR Breast Density Category b: There are scattered areas of
fibroglandular density.
FINDINGS: Tomosynthesis and synthesized spot-compression CC and MLO views of
the area of concern in the RIGHT breast were obtained.

Spot compression images confirm at least 2 possibly 3 adjacent
isodense circumscribed masses localizing to the UPPER INNER QUADRANT
at MIDDLE to POSTERIOR depth, the largest measuring approximately
1.2 cm. The more superior of the masses is associated with scattered
calcifications. There is no associated architectural distortion.

Targeted RIGHT breast ultrasound is performed, showing oval
circumscribed parallel clustered cysts at the 1 o'clock position
approximately 7 cm from nipple MIDDLE depth, measuring approximately
0.5 x 0.7 x 1.1 cm, containing thin internal septations and
calcifications, demonstrating posterior acoustic enhancement and no
internal power Doppler flow corresponding to the more superior of
the mammographic findings.

At the 12:30 o'clock position approximately 6 cm from nipple at
MIDDLE depth are similar oval circumscribed parallel clustered cysts
measuring approximately 0.3 x 1.0 x 1.0 cm, containing thin internal
septations, demonstrating posterior acoustic enhancement and no
internal power Doppler flow.

At the 1 o'clock position approximately 6 cm from nipple at
POSTERIOR depth is an oval circumscribed parallel nearly anechoic
mass with scattered internal echoes measuring approximately 0 3 x
0.9 x 0.7 cm, demonstrating posterior acoustic enhancement and no
internal power Doppler flow.

No suspicious solid mass or abnormal acoustic shadowing is
identified in the UPPER INNER QUADRANT.
IMPRESSION: Likely benign clustered cysts in the UPPER INNER QUADRANT of the
RIGHT breast which accounts for the screening mammographic findings.

RECOMMENDATION:
RIGHT breast ultrasound in 6 months to confirm stability of the
likely benign findings.

I have discussed the findings and recommendations with the patient.
If applicable, a reminder letter will be sent to the patient
regarding the next appointment.

BI-RADS CATEGORY  3: Probably benign.

## 2022-07-19 NOTE — Progress Notes (Unsigned)
[facility] [place] Phone: [phone] Subjective:   Fontaine No, am serving as a scribe for Dr. Hulan Saas.  I'm seeing this patient by the request  of:  Harlan Stains, MD  CC: low back pain   Hip pain  [id number]  Fujiko TIMEA BREED is a 52 y.o. female coming in with complaint of lower back and B hip pain, L>R. Patient states that she has had chronic LBP for years. B hip pain over GT. Sitting increases her pain. Likes to play tennis, pickleball, and is an avid Orthoptist. No pain with playing but hard to get out of car. Has not seen anyone for her back/hip pain.     No past medical history on file. No past surgical history on file. Social History   Socioeconomic History   Marital status: Married    Spouse name: Not on file   Number of children: Not on file   Years of education: Not on file   Highest education level: Not on file  Occupational History   Not on file  Tobacco Use   Smoking status: Never   Smokeless tobacco: Never  Substance and Sexual Activity   Alcohol use: Not on file   Drug use: Not on file   Sexual activity: Not on file  Other Topics Concern   Not on file  Social History Narrative   Not on file   Social Determinants of Health   Financial Resource Strain: Not on file  Food Insecurity: Not on file  Transportation Needs: Not on file  Physical Activity: Not on file  Stress: Not on file  Social Connections: Not on file   No Known Allergies No family history on file.  Current Outpatient Medications (Endocrine & Metabolic):    estradiol (ESTRACE) 0.5 MG tablet, Take 0.5 mg by mouth daily.   NORETHINDRONE ACET-ETHINYL EST PO, Take 1 tablet by mouth daily.   SYNTHROID 50 MCG tablet,       Current Outpatient Medications (Other):    gabapentin (NEURONTIN) 100 MG capsule, Take 2 capsules (200 mg total) by mouth at bedtime.   Reviewed prior external information including  notes and imaging from  primary care provider As well as notes that were available from care everywhere and other healthcare systems.  Past medical history, social, surgical and family history all reviewed in electronic medical record.  No pertanent information unless stated regarding to the chief complaint.   Review of Systems:  No headache, visual changes, nausea, vomiting, diarrhea, constipation, dizziness, abdominal pain, skin rash, fevers, chills, night sweats, weight loss, swollen lymph nodes, body aches, joint swelling, chest pain, shortness of breath, mood changes. POSITIVE muscle aches  Objective  Blood pressure 108/80, pulse 82, height 5\' 7"  (1.702 m), weight 277 lb (125.6 kg), SpO2 97 %.   General: No apparent distress alert and oriented x3 mood and affect normal, dressed appropriately.  HEENT: Pupils equal, extraocular movements intact  Respiratory: Patient's speak in full sentences and does not appear short of breath  Cardiovascular: No lower extremity edema, non tender, no erythema  Hip exam shows tightness with SLT, positive FABER  Patient does have some weakness of the hip abductors.  No pelvic shear noted.  Patient does have some tightness with straight leg test but no true radicular symptoms.   Osteopathic findings C4 flexed rotated and side bent left C7 flexed rotated and side bent left T3 extended rotated and side bent right inhaled  third rib L4 flexed rotated and side bent left Sacrum right on right  [id number]; 15 additional minutes spent for Therapeutic exercises as stated in above notes.  This included exercises focusing on stretching, strengthening, with significant focus on eccentric aspects.   Long term goals include an improvement in range of motion, strength, endurance as well as avoiding reinjury. Patient's frequency would include in 1-2 times a day, 3-5 times a week for a duration of 6-12 weeks.Sacroiliac Joint Mobilization and Rehab 1. Work on pretzel  stretching, shoulder back and leg draped in front. 3-5 sets, 30 sec.. 2. hip abductor rotations. standing, hip flexion and rotation outward then inward. 3 sets, 15 reps. when can do comfortably, add ankle weights starting at 2 pounds.  3. cross over stretching - shoulder back to ground, same side leg crossover. 3-5 sets for 30 min..  4. rolling up and back knees to chest and rocking. 5. sacral tilt - 5 sets, hold for 5-10 seconds   Proper technique shown and discussed handout in great detail with ATC.  All questions were discussed and answered.      Impression and Recommendations:     The above documentation has been reviewed and is accurate and complete Lyndal Pulley, DO

## 2022-07-25 ENCOUNTER — Ambulatory Visit: Payer: BC Managed Care – PPO | Admitting: Family Medicine

## 2022-07-25 ENCOUNTER — Ambulatory Visit (INDEPENDENT_AMBULATORY_CARE_PROVIDER_SITE_OTHER): Payer: BC Managed Care – PPO

## 2022-07-25 VITALS — BP 108/80 | HR 82 | Ht 67.0 in | Wt 277.0 lb

## 2022-07-25 DIAGNOSIS — M533 Sacrococcygeal disorders, not elsewhere classified: Secondary | ICD-10-CM | POA: Diagnosis not present

## 2022-07-25 DIAGNOSIS — M545 Low back pain, unspecified: Secondary | ICD-10-CM

## 2022-07-25 DIAGNOSIS — M25551 Pain in right hip: Secondary | ICD-10-CM

## 2022-07-25 DIAGNOSIS — M9908 Segmental and somatic dysfunction of rib cage: Secondary | ICD-10-CM | POA: Diagnosis not present

## 2022-07-25 DIAGNOSIS — M9901 Segmental and somatic dysfunction of cervical region: Secondary | ICD-10-CM

## 2022-07-25 DIAGNOSIS — M25552 Pain in left hip: Secondary | ICD-10-CM

## 2022-07-25 DIAGNOSIS — M9903 Segmental and somatic dysfunction of lumbar region: Secondary | ICD-10-CM

## 2022-07-25 DIAGNOSIS — M9902 Segmental and somatic dysfunction of thoracic region: Secondary | ICD-10-CM

## 2022-07-25 DIAGNOSIS — M9904 Segmental and somatic dysfunction of sacral region: Secondary | ICD-10-CM | POA: Diagnosis not present

## 2022-07-25 MED ORDER — GABAPENTIN 100 MG PO CAPS: 200.0000 mg | ORAL_CAPSULE | Freq: Every day | ORAL | 0 refills | Status: DC

## 2022-07-25 NOTE — Assessment & Plan Note (Signed)

## 2022-07-25 NOTE — Assessment & Plan Note (Signed)
Patient does have sacroiliac dysfunction.  I discussed icing regimen and home exercises, discussed core strengthening exercises.  Discussed different exercises as well.  Due to some of the different radicular symptoms that are intermittent we did discuss which activities to do and gabapentin was prescribed today.  Patient did respond well to osteopathic manipulation and follow-up again in 6 weeks

## 2022-07-25 NOTE — Patient Instructions (Addendum)
Xray today Exercises 3x a week Gabapentin 200mg  at night Ice after activity See me again in 5-6 weeks

## 2022-09-04 NOTE — Progress Notes (Signed)
  [facility] [address] [place] [address] Phone: [phone] Subjective:   Melinda Jackson, am serving as a scribe for Dr. Antoine Primas.  I'm seeing this patient by the request  of:  Laurann Montana, MD  CC: back and neck pain   [id number]  Jalesa HOPELYNN FUKUHARA is a 52 y.o. female coming in with complaint of back and neck pain. OMT on 07/25/2022. Patient states that she has felt some improvement. In the morning she will have discomfort in lower back as well as sitting for prolonged periods. Little pain during pickleball but has pain afterwards. Using standing desk which is helping as well.   Medications patient has been prescribed: gabapentin  Taking:         Reviewed prior external information including notes and imaging from previsou exam, outside providers and external EMR if available.   As well as notes that were available from care everywhere and other healthcare systems.  Past medical history, social, surgical and family history all reviewed in electronic medical record.  No pertanent information unless stated regarding to the chief complaint.   No past medical history on file.  No Known Allergies   Review of Systems:  No headache, visual changes, nausea, vomiting, diarrhea, constipation, dizziness, abdominal pain, skin rash, fevers, chills, night sweats, weight loss, swollen lymph nodes, body aches, joint swelling, chest pain, shortness of breath, mood changes. POSITIVE muscle aches  Objective  Blood pressure 118/86, pulse 85, height 5\' 7"  (1.702 m), weight 279 lb (126.6 kg), SpO2 98 %.   General: No apparent distress alert and oriented x3 mood and affect normal, dressed appropriately.  HEENT: Pupils equal, extraocular movements intact  Respiratory: Patient's speak in full sentences and does not appear short of breath  Cardiovascular: No lower extremity edema, non tender, no erythema  MSK:  Back does have some loss of lordosis  still noted.  Still working on core strength.  Some mild weakness in hip abductor strength.  Neck exam does have some tightness noted as well.  Osteopathic findings  C3 flexed rotated and side bent right C7 flexed rotated and side bent left T3 extended rotated and side bent right inhaled rib T9 extended rotated and side bent left L2 flexed rotated and side bent right Sacrum right on right     Assessment and Plan:  SI (sacroiliac) joint dysfunction Patient does have some loss of lordosis.  Patient is still working on core strengthening.  Has had some recent difficulty that likely was contributing to some of the discomfort and pain.  Discussed home exercises and icing regimen.  Follow-up with me again in 6 to 8 weeks    Nonallopathic problems  Decision today to treat with OMT was based on Physical Exam  After verbal consent patient was treated with HVLA, ME, FPR techniques in cervical, rib, thoracic, lumbar, and sacral  areas  Patient tolerated the procedure well with improvement in symptoms  Patient given exercises, stretches and lifestyle modifications  See medications in patient instructions if given  Patient will follow up in 4-8 weeks    The above documentation has been reviewed and is accurate and complete Judi Saa, DO          Note: This dictation was prepared with Dragon dictation along with smaller phrase technology. Any transcriptional errors that result from this process are unintentional.

## 2022-09-05 ENCOUNTER — Ambulatory Visit: Payer: BC Managed Care – PPO | Admitting: Family Medicine

## 2022-09-05 ENCOUNTER — Encounter: Payer: Self-pay | Admitting: Family Medicine

## 2022-09-05 VITALS — BP 118/86 | HR 85 | Ht 67.0 in | Wt 279.0 lb

## 2022-09-05 DIAGNOSIS — M9904 Segmental and somatic dysfunction of sacral region: Secondary | ICD-10-CM

## 2022-09-05 DIAGNOSIS — M9903 Segmental and somatic dysfunction of lumbar region: Secondary | ICD-10-CM | POA: Diagnosis not present

## 2022-09-05 DIAGNOSIS — M9908 Segmental and somatic dysfunction of rib cage: Secondary | ICD-10-CM

## 2022-09-05 DIAGNOSIS — M533 Sacrococcygeal disorders, not elsewhere classified: Secondary | ICD-10-CM | POA: Diagnosis not present

## 2022-09-05 DIAGNOSIS — M9902 Segmental and somatic dysfunction of thoracic region: Secondary | ICD-10-CM

## 2022-09-05 DIAGNOSIS — M9901 Segmental and somatic dysfunction of cervical region: Secondary | ICD-10-CM

## 2022-09-05 NOTE — Assessment & Plan Note (Signed)
Patient does have some loss of lordosis.  Patient is still working on core strengthening.  Has had some recent difficulty that likely was contributing to some of the discomfort and pain.  Discussed home exercises and icing regimen.  Follow-up with me again in 6 to 8 weeks

## 2022-10-17 NOTE — Progress Notes (Unsigned)
  [facility] [address] [place] [address] Phone: [phone] Subjective:   Melinda Jackson, am serving as a scribe for Dr. Antoine Primas.  I'm seeing this patient by the request  of:  Laurann Montana, MD  CC: Low back pain follow-up  [id number]  Melinda Jackson is a 52 y.o. female coming in with complaint of back and neck pain. OMT on 09/05/2022. Patient states that she is here for routine OMT. States she is out of gabapentin and would like a refill. Patient will be going on vacation where she will be walking ablot and would like to have that with her   Medications patient has been prescribed:   Taking:         Reviewed prior external information including notes and imaging from previsou exam, outside providers and external EMR if available.   As well as notes that were available from care everywhere and other healthcare systems.  Past medical history, social, surgical and family history all reviewed in electronic medical record.  No pertanent information unless stated regarding to the chief complaint.    Review of Systems:  No headache, visual changes, nausea, vomiting, diarrhea, constipation, dizziness, abdominal pain, skin rash, fevers, chills, night sweats, weight loss, swollen lymph nodes, body aches, joint swelling, chest pain, shortness of breath, mood changes. POSITIVE muscle aches  Objective  Blood pressure 120/78, pulse 65, height 5\' 7"  (1.702 m), weight 274 lb (124.3 kg), SpO2 97 %.   General: No apparent distress alert and oriented x3 mood and affect normal, dressed appropriately.  HEENT: Pupils equal, extraocular movements intact  Respiratory: Patient's speak in full sentences and does not appear short of breath  Cardiovascular: No lower extremity edema, non tender, no erythema  Low back exam does have significant tightness noted around the sacroiliac joint bilaterally.  Patient does have some tightness with FABER test  right greater than left.  Osteopathic findings   C5 flexed rotated and side bent left T3 extended rotated and side bent right inhaled rib T8 extended rotated and side bent left L3 flexed rotated and side bent right Sacrum right on right       Assessment and Plan:  SI (sacroiliac) joint dysfunction Chronic problem with some worsening symptoms.  Given prednisone with patient traveling.  We discussed with patient about different treatment options.  Discussed icing regimen and home exercises, continue to work on core strengthening exercises.  Follow-up again in 6 to 8 weeks as well.    Nonallopathic problems  Decision today to treat with OMT was based on Physical Exam  After verbal consent patient was treated with HVLA, ME, FPR techniques in cervical, rib, thoracic, lumbar, and sacral  areas  Patient tolerated the procedure well with improvement in symptoms  Patient given exercises, stretches and lifestyle modifications  See medications in patient instructions if given  Patient will follow up in 4-8 weeks     The above documentation has been reviewed and is accurate and complete Judi Saa, DO         Note: This dictation was prepared with Dragon dictation along with smaller phrase technology. Any transcriptional errors that result from this process are unintentional.

## 2022-10-18 ENCOUNTER — Encounter: Payer: Self-pay | Admitting: Family Medicine

## 2022-10-18 ENCOUNTER — Ambulatory Visit: Payer: BC Managed Care – PPO | Admitting: Family Medicine

## 2022-10-18 VITALS — BP 120/78 | HR 65 | Ht 67.0 in | Wt 274.0 lb

## 2022-10-18 DIAGNOSIS — M9902 Segmental and somatic dysfunction of thoracic region: Secondary | ICD-10-CM | POA: Diagnosis not present

## 2022-10-18 DIAGNOSIS — M9901 Segmental and somatic dysfunction of cervical region: Secondary | ICD-10-CM | POA: Diagnosis not present

## 2022-10-18 DIAGNOSIS — M9903 Segmental and somatic dysfunction of lumbar region: Secondary | ICD-10-CM | POA: Diagnosis not present

## 2022-10-18 DIAGNOSIS — M533 Sacrococcygeal disorders, not elsewhere classified: Secondary | ICD-10-CM | POA: Diagnosis not present

## 2022-10-18 DIAGNOSIS — M9908 Segmental and somatic dysfunction of rib cage: Secondary | ICD-10-CM | POA: Diagnosis not present

## 2022-10-18 DIAGNOSIS — M9904 Segmental and somatic dysfunction of sacral region: Secondary | ICD-10-CM | POA: Diagnosis not present

## 2022-10-18 MED ORDER — GABAPENTIN 100 MG PO CAPS: 200.0000 mg | ORAL_CAPSULE | Freq: Every day | ORAL | 0 refills | Status: DC

## 2022-10-18 MED ORDER — PREDNISONE 20 MG PO TABS: 40.0000 mg | ORAL_TABLET | Freq: Every day | ORAL | 0 refills | Status: AC

## 2022-10-18 NOTE — Assessment & Plan Note (Signed)
Chronic problem with some worsening symptoms.  Given prednisone with patient traveling.  We discussed with patient about different treatment options.  Discussed icing regimen and home exercises, continue to work on core strengthening exercises.  Follow-up again in 6 to 8 weeks as well.

## 2022-10-18 NOTE — Patient Instructions (Addendum)
Good to see you Gabapentin refill sent  Prednisone 40mg  daily for 5 days Follow up in 5 weeks
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
## 2022-11-16 ENCOUNTER — Encounter: Payer: Self-pay | Admitting: Family Medicine

## 2022-11-16 ENCOUNTER — Ambulatory Visit: Payer: BC Managed Care – PPO | Admitting: Family Medicine

## 2022-11-16 VITALS — BP 110/80 | HR 67 | Ht 67.0 in

## 2022-11-16 DIAGNOSIS — M545 Low back pain, unspecified: Secondary | ICD-10-CM

## 2022-11-16 DIAGNOSIS — M5416 Radiculopathy, lumbar region: Secondary | ICD-10-CM | POA: Diagnosis not present

## 2022-11-16 NOTE — Patient Instructions (Signed)
MRI [place] Take 3 gabapentin I will write you with results

## 2022-11-16 NOTE — Assessment & Plan Note (Signed)
Worsening lumbar radiculopathy.  Left-sided.  Seems to go more to the gluteal area than anywhere else.  Patient does have a grade 1 anterior listhesis noted.  Due to that this is contributing to some of the discomfort.  Concerned because this is affecting patient's daily activity.  Has failed home exercises, formal physical therapy, gabapentin and anti-inflammatories.  Discussed with patient icing regimen and home exercises.  Patient at this point unfortunately has failed all of these and I do feel that it is time to get advanced imaging.  Depending on findings patient could be evaluated for potential epidurals.  Follow-up with me again after imaging to discuss further.

## 2022-11-25 ENCOUNTER — Other Ambulatory Visit: Payer: BC Managed Care – PPO

## 2022-11-25 ENCOUNTER — Ambulatory Visit (INDEPENDENT_AMBULATORY_CARE_PROVIDER_SITE_OTHER): Payer: BC Managed Care – PPO

## 2022-11-25 DIAGNOSIS — M545 Low back pain, unspecified: Secondary | ICD-10-CM

## 2022-11-25 DIAGNOSIS — G8929 Other chronic pain: Secondary | ICD-10-CM | POA: Diagnosis not present

## 2022-12-05 ENCOUNTER — Other Ambulatory Visit: Payer: Self-pay

## 2022-12-05 ENCOUNTER — Telehealth: Payer: Self-pay | Admitting: Family Medicine

## 2022-12-05 DIAGNOSIS — M5416 Radiculopathy, lumbar region: Secondary | ICD-10-CM

## 2022-12-05 NOTE — Telephone Encounter (Signed)
Order placed

## 2022-12-05 NOTE — Telephone Encounter (Signed)
Patient called back in response to her MRI. She would like proceed with the epidural injection. Can this order be put in for her?  Patient was given [facility]'s number to schedule.

## 2022-12-11 ENCOUNTER — Ambulatory Visit
Admission: RE | Admit: 2022-12-11 | Discharge: 2022-12-11 | Disposition: A | Discharge status: Home / Self Care | Payer: BC Managed Care – PPO | Source: Ambulatory Visit | Attending: Family Medicine | Admitting: Family Medicine

## 2022-12-11 DIAGNOSIS — M5416 Radiculopathy, lumbar region: Secondary | ICD-10-CM

## 2022-12-11 MED ORDER — IOPAMIDOL (ISOVUE-M 200) INJECTION 41%
1.0000 mL | Freq: Once | INTRAMUSCULAR | Status: AC
  Administered 2022-12-11: 1 mL via EPIDURAL

## 2022-12-11 MED ORDER — METHYLPREDNISOLONE ACETATE 40 MG/ML INJ SUSP (RADIOLOG
80.0000 mg | Freq: Once | INTRAMUSCULAR | Status: AC
  Administered 2022-12-11: 80 mg via EPIDURAL

## 2022-12-11 NOTE — Discharge Instructions (Signed)

## 2022-12-18 ENCOUNTER — Telehealth: Payer: Self-pay | Admitting: Family Medicine

## 2022-12-18 NOTE — Telephone Encounter (Signed)
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 She said that this was new for her and wanted to see is there was anything different she needed to do as far as treatment, exercises, etc?  We were trying to schedule an appointment with Dr Katrinka Blazing but first available is not until July 24th.   Please advise.

## 2022-12-19 NOTE — Telephone Encounter (Signed)
Sent patient MyChart message.

## 2023-02-01 NOTE — Progress Notes (Signed)
[facility] [address] [place] [address] Phone: [phone] Subjective:   Bruce Donath, am serving as a scribe for Dr. Antoine Primas.  I'm seeing this patient by the request  of:  Laurann Montana, MD  CC: Back pain  [id number]  11/16/2022 Worsening lumbar radiculopathy.  Left-sided.  Seems to go more to the gluteal area than anywhere else.  Patient does have a grade 1 anterior listhesis noted.  Due to that this is contributing to some of the discomfort.  Concerned because this is affecting patient's daily activity.  Has failed home exercises, formal physical therapy, gabapentin and anti-inflammatories.  Discussed with patient icing regimen and home exercises.  Patient at this point unfortunately has failed all of these and I do feel that it is time to get advanced imaging.  Depending on findings patient could be evaluated for potential epidurals.  Follow-up with me again after imaging to discuss further.     Update 02/02/2023 Salimah KISA FUJII is a 52 y.o. female coming in with complaint of lumbar spine pain. Epidural 12/11/2022. Patient states that epidural is wearing off but was effective initially.       No past medical history on file. Past Surgical History:  Procedure Laterality Date   CHOLECYSTECTOMY     TONSILLECTOMY     Social History   Socioeconomic History   Marital status: Married    Spouse name: Not on file   Number of children: Not on file   Years of education: Not on file   Highest education level: Not on file  Occupational History   Not on file  Tobacco Use   Smoking status: Never   Smokeless tobacco: Never  Substance and Sexual Activity   Alcohol use: Not on file   Drug use: Not on file   Sexual activity: Not on file  Other Topics Concern   Not on file  Social History Narrative   Not on file   Social Determinants of Health   Financial Resource Strain: Not on file  Food Insecurity: Not on file  Transportation  Needs: Not on file  Physical Activity: Not on file  Stress: Not on file  Social Connections: Not on file   No Known Allergies No family history on file.  Current Outpatient Medications (Endocrine & Metabolic):    estradiol (ESTRACE) 0.5 MG tablet, Take 0.5 mg by mouth daily.   NORETHINDRONE ACET-ETHINYL EST PO, Take 1 tablet by mouth daily.   predniSONE (DELTASONE) 20 MG tablet, Take 2 tablets (40 mg total) by mouth daily with breakfast.   SYNTHROID 50 MCG tablet,       Current Outpatient Medications (Other):    gabapentin (NEURONTIN) 100 MG capsule, Take 2 capsules (200 mg total) by mouth at bedtime.   Reviewed prior external information including notes and imaging from  primary care provider As well as notes that were available from care everywhere and other healthcare systems.  Past medical history, social, surgical and family history all reviewed in electronic medical record.  No pertanent information unless stated regarding to the chief complaint.   Review of Systems:  No headache, visual changes, nausea, vomiting, diarrhea, constipation, dizziness, abdominal pain, skin rash, fevers, chills, night sweats, weight loss, swollen lymph nodes, body aches, joint swelling, chest pain, shortness of breath, mood changes. POSITIVE muscle aches  Objective  Blood pressure 118/84, pulse 83, height 5\' 7"  (1.702 m), weight 292 lb (132.5 kg), SpO2 97%.   General: No apparent distress  alert and oriented x3 mood and affect normal, dressed appropriately.  HEENT: Pupils equal, extraocular movements intact  Respiratory: Patient's speak in full sentences and does not appear short of breath  Cardiovascular: No lower extremity edema, non tender, no erythema      Impression and Recommendations:      The above documentation has been reviewed and is accurate and complete Judi Saa, DO

## 2023-02-02 ENCOUNTER — Ambulatory Visit: Payer: BC Managed Care – PPO | Admitting: Family Medicine

## 2023-02-02 ENCOUNTER — Encounter: Payer: Self-pay | Admitting: Family Medicine

## 2023-02-02 VITALS — BP 118/84 | HR 83 | Ht 67.0 in | Wt 292.0 lb

## 2023-02-02 DIAGNOSIS — M5416 Radiculopathy, lumbar region: Secondary | ICD-10-CM | POA: Diagnosis not present

## 2023-02-02 MED ORDER — GABAPENTIN 100 MG PO CAPS: 200.0000 mg | ORAL_CAPSULE | Freq: Every day | ORAL | 0 refills | Status: AC

## 2023-02-02 NOTE — Patient Instructions (Addendum)
Epidural same level [phone] Letter stating that she is taking gabapentin for nerve pain 200mg  at night Refill gabapentin Ok to do stretches 3x a week and strength 2x a week Have fun in [place] See me in 6-8 weeks

## 2023-02-02 NOTE — Assessment & Plan Note (Signed)
Patient initially did respond extremely well to the epidural and was 80 to 85% better.  We discussed with patient about icing regimen and home exercises.  Discussed which activities to do and which ones to avoid.  Discussed at this point that I do feel that a repeat epidural would be beneficial to make improvement ago hopefully for a longer duration.  We discussed the gabapentin at 200 mg and patient given a note so she can go to [place] with this medication.  Follow-up with me again in 7 to 8 weeks to see how patient is responding to this therapy.  Patient is in agreement with the plan

## 2023-02-26 ENCOUNTER — Ambulatory Visit
Admission: RE | Admit: 2023-02-26 | Discharge: 2023-02-26 | Disposition: A | Discharge status: Home / Self Care | Payer: BC Managed Care – PPO | Source: Ambulatory Visit | Attending: Family Medicine | Admitting: Family Medicine

## 2023-02-26 DIAGNOSIS — M5416 Radiculopathy, lumbar region: Secondary | ICD-10-CM

## 2023-02-26 MED ORDER — METHYLPREDNISOLONE ACETATE 40 MG/ML INJ SUSP (RADIOLOG
80.0000 mg | Freq: Once | INTRAMUSCULAR | Status: AC
  Administered 2023-02-26: 80 mg via EPIDURAL

## 2023-02-26 MED ORDER — IOPAMIDOL (ISOVUE-M 200) INJECTION 41%
1.0000 mL | Freq: Once | INTRAMUSCULAR | Status: AC
  Administered 2023-02-26: 1 mL via EPIDURAL

## 2023-02-26 NOTE — Discharge Instructions (Signed)

## 2023-03-05 ENCOUNTER — Other Ambulatory Visit: Payer: BC Managed Care – PPO

## 2023-04-06 NOTE — Progress Notes (Unsigned)
[facility] [address] [place] [address] Phone: [phone] Subjective:   Melinda Jackson, am serving as a scribe for Dr. Antoine Primas.  I'm seeing this patient by the request  of:  Laurann Montana, MD  CC: Back pain follow-up  [id number]  02/02/2023 Patient initially did respond extremely well to the epidural and was 80 to 85% better.  We discussed with patient about icing regimen and home exercises.  Discussed which activities to do and which ones to avoid.  Discussed at this point that I do feel that a repeat epidural would be beneficial to make improvement ago hopefully for a longer duration.  We discussed the gabapentin at 200 mg and patient given a note so she can go to [place] with this medication.  Follow-up with me again in 7 to 8 weeks to see how patient is responding to this therapy.  Patient is in agreement with the plan     Updated 04/10/2023 Melinda Jackson is a 52 y.o. female coming in with complaint of back pain, did have another epidural given on August 19 wants to continue the gabapentin.  Patient states went scuba diving and that didn't do her any favors. No new concerns.      No past medical history on file. Past Surgical History:  Procedure Laterality Date   CHOLECYSTECTOMY     TONSILLECTOMY     Social History   Socioeconomic History   Marital status: Married    Spouse name: Not on file   Number of children: Not on file   Years of education: Not on file   Highest education level: Not on file  Occupational History   Not on file  Tobacco Use   Smoking status: Never   Smokeless tobacco: Never  Substance and Sexual Activity   Alcohol use: Not on file   Drug use: Not on file   Sexual activity: Not on file  Other Topics Concern   Not on file  Social History Narrative   Not on file   Social Determinants of Health   Financial Resource Strain: Not on file  Food Insecurity: Not on file  Transportation Needs: Not  on file  Physical Activity: Not on file  Stress: Not on file  Social Connections: Not on file   No Known Allergies No family history on file.  Current Outpatient Medications (Endocrine & Metabolic):    estradiol (ESTRACE) 0.5 MG tablet, Take 0.5 mg by mouth daily.   NORETHINDRONE ACET-ETHINYL EST PO, Take 1 tablet by mouth daily.   predniSONE (DELTASONE) 20 MG tablet, Take 2 tablets (40 mg total) by mouth daily with breakfast.   SYNTHROID 50 MCG tablet,       Current Outpatient Medications (Other):    gabapentin (NEURONTIN) 100 MG capsule, Take 2 capsules (200 mg total) by mouth at bedtime.   Reviewed prior external information including notes and imaging from  primary care provider As well as notes that were available from care everywhere and other healthcare systems.  Past medical history, social, surgical and family history all reviewed in electronic medical record.  No pertanent information unless stated regarding to the chief complaint.   Review of Systems:  No headache, visual changes, nausea, vomiting, diarrhea, constipation, dizziness, abdominal pain, skin rash, fevers, chills, night sweats, weight loss, swollen lymph nodes, body aches, joint swelling, chest pain, shortness of breath, mood changes. POSITIVE muscle aches  Objective  Blood pressure 126/74, pulse 93, height 5\' 7"  (1.702  m), weight 299 lb (135.6 kg), SpO2 95%.   General: No apparent distress alert and oriented x3 mood and affect normal, dressed appropriately.  HEENT: Pupils equal, extraocular movements intact  Respiratory: Patient's speak in full sentences and does not appear short of breath  Cardiovascular: No lower extremity edema, non tender, no erythema  Low back exam shows loss of lordosis noted.  Worsening pain with any extension of the back.  Seems to be worse on the left greater than the right. Patient does not have much radicular symptoms down the leg anymore.  Seems to be more localized in the  back.    Impression and Recommendations:    The above documentation has been reviewed and is accurate and complete Judi Saa, DO

## 2023-04-10 ENCOUNTER — Ambulatory Visit: Payer: BC Managed Care – PPO | Admitting: Family Medicine

## 2023-04-10 ENCOUNTER — Encounter: Payer: Self-pay | Admitting: Family Medicine

## 2023-04-10 VITALS — BP 126/74 | HR 93 | Ht 67.0 in | Wt 299.0 lb

## 2023-04-10 DIAGNOSIS — M545 Low back pain, unspecified: Secondary | ICD-10-CM

## 2023-04-10 DIAGNOSIS — M5416 Radiculopathy, lumbar region: Secondary | ICD-10-CM

## 2023-04-10 NOTE — Patient Instructions (Addendum)
Good to see you! Thanks for the chocolates Medial branch block  If it works you can have radio frequency ablation Follow up 6-8 weeks after block

## 2023-04-10 NOTE — Assessment & Plan Note (Signed)
Continues to have some pain in the back.  We discussed the possibility of restarting the manipulation therapy.  At this point with patient having worsening pain with extension I am more concerned for more of the facet arthropathy and giving her more the difficulty and pain at the moment.  Would like to try medial branch blocks at L4-L5 and L5-S1 on the left side only.  See how patient responds and then see if patient is a candidate for radiofrequency ablation.  Increase activity slowly otherwise.  Will continue to work on weight loss and core stability and strengthening.  Patient will follow-up again in 8 weeks otherwise.  No other medication changes.

## 2023-05-25 NOTE — Discharge Instructions (Signed)
Medial Branch Block Discharge Instructions  Take over-the-counter and prescription medicines only as told by your health care provider.  Do not drive the day of your procedure  Return to your normal activities as told by your health care provider.   If injection site is sore you may ice the area for 20 minutes, 2-3 times a day.   Check your injection site every day for signs of infection. Check for: Redness, swelling, or pain. Fluid or blood. Warmth. Pus or a bad smell.  Please contact our office at [phone] if: You have a fever or chills. You have any signs of infection. You develop any numbness or weakness.   Thank you for visiting our office.  

## 2023-05-28 ENCOUNTER — Inpatient Hospital Stay
Admission: RE | Admit: 2023-05-28 | Discharge: 2023-05-28 | Disposition: A | Discharge status: Home / Self Care | Payer: BC Managed Care – PPO | Source: Ambulatory Visit | Attending: Family Medicine | Admitting: Family Medicine

## 2023-05-28 ENCOUNTER — Other Ambulatory Visit: Payer: Self-pay | Admitting: Family Medicine

## 2023-05-28 ENCOUNTER — Ambulatory Visit
Admission: RE | Admit: 2023-05-28 | Discharge: 2023-05-28 | Disposition: A | Discharge status: Home / Self Care | Payer: BC Managed Care – PPO | Source: Ambulatory Visit | Attending: Family Medicine | Admitting: Family Medicine

## 2023-05-28 DIAGNOSIS — M5416 Radiculopathy, lumbar region: Secondary | ICD-10-CM

## 2023-05-28 DIAGNOSIS — M545 Low back pain, unspecified: Secondary | ICD-10-CM

## 2023-05-31 ENCOUNTER — Other Ambulatory Visit: Payer: Self-pay | Admitting: Family Medicine

## 2023-05-31 DIAGNOSIS — M5416 Radiculopathy, lumbar region: Secondary | ICD-10-CM

## 2023-06-04 NOTE — Discharge Instructions (Signed)
Radio Frequency Ablation Post Procedure Discharge Instructions ? ?May resume a regular diet and any medications that you routinely take (including pain medications). ?No driving day of procedure. ?Upon discharge go home and rest for at least 4 hours.  May use an ice pack as needed to injection sites on back. ?Remove bandades later, today. ? ? ? ?Please contact our office at [phone] for the following symptoms: ? ?Fever greater than 100 degrees ?Increased swelling, pain, or redness at injection site. ? ? ?Thank you for visiting [facility].  ?

## 2023-06-05 ENCOUNTER — Other Ambulatory Visit: Payer: Self-pay | Admitting: Family Medicine

## 2023-06-05 ENCOUNTER — Ambulatory Visit
Admission: RE | Admit: 2023-06-05 | Discharge: 2023-06-05 | Disposition: A | Discharge status: Home / Self Care | Payer: BC Managed Care – PPO | Source: Ambulatory Visit | Attending: Family Medicine | Admitting: Family Medicine

## 2023-06-05 DIAGNOSIS — M5416 Radiculopathy, lumbar region: Secondary | ICD-10-CM

## 2023-06-05 MED ORDER — MIDAZOLAM HCL 2 MG/2ML IJ SOLN
1.0000 mg | INTRAMUSCULAR | Status: DC | PRN
  Administered 2023-06-05 (×4): 1 mg via INTRAVENOUS

## 2023-06-05 MED ORDER — METHYLPREDNISOLONE ACETATE 40 MG/ML INJ SUSP (RADIOLOG
80.0000 mg | Freq: Once | INTRAMUSCULAR | Status: AC
  Administered 2023-06-05: 80 mg via INTRALESIONAL

## 2023-06-05 MED ORDER — FENTANYL CITRATE PF 50 MCG/ML IJ SOSY
25.0000 ug | PREFILLED_SYRINGE | INTRAMUSCULAR | Status: DC | PRN
  Administered 2023-06-05: 25 ug via INTRAVENOUS
  Administered 2023-06-05: 50 ug via INTRAVENOUS
  Administered 2023-06-05 (×3): 25 ug via INTRAVENOUS

## 2023-06-05 MED ORDER — KETOROLAC TROMETHAMINE 30 MG/ML IJ SOLN
30.0000 mg | Freq: Once | INTRAMUSCULAR | Status: AC
  Administered 2023-06-05: 30 mg via INTRAVENOUS

## 2023-06-05 MED ORDER — SODIUM CHLORIDE 0.9 % IV SOLN
INTRAVENOUS | Status: DC
Start: 2023-06-05 — End: 2023-06-06

## 2023-06-05 NOTE — Progress Notes (Signed)
Pt back in nursing recovery area. Pt still drowsy from procedure but will wake up when spoken to. Pt follows commands, talks in complete sentences and has no complaints at this time. Pt will be monitored until discharged by Radiologist.
